# Patient Record
Sex: Male | Born: 1945 | Race: White | Hispanic: No | Marital: Married | Smoking: Former smoker
Health system: Southern US, Community
[De-identification: ages and names within clinical notes are randomized; demographics above are authoritative.]

## PROBLEM LIST (undated history)

## (undated) DIAGNOSIS — N200 Calculus of kidney: Secondary | ICD-10-CM

## (undated) DIAGNOSIS — G5603 Carpal tunnel syndrome, bilateral upper limbs: Secondary | ICD-10-CM

## (undated) DIAGNOSIS — I219 Acute myocardial infarction, unspecified: Secondary | ICD-10-CM

## (undated) DIAGNOSIS — I2510 Atherosclerotic heart disease of native coronary artery without angina pectoris: Secondary | ICD-10-CM

## (undated) DIAGNOSIS — K760 Fatty (change of) liver, not elsewhere classified: Secondary | ICD-10-CM

## (undated) DIAGNOSIS — E538 Deficiency of other specified B group vitamins: Secondary | ICD-10-CM

## (undated) DIAGNOSIS — K219 Gastro-esophageal reflux disease without esophagitis: Secondary | ICD-10-CM

## (undated) DIAGNOSIS — K7689 Other specified diseases of liver: Secondary | ICD-10-CM

## (undated) DIAGNOSIS — I10 Essential (primary) hypertension: Secondary | ICD-10-CM

## (undated) DIAGNOSIS — E785 Hyperlipidemia, unspecified: Secondary | ICD-10-CM

## (undated) DIAGNOSIS — C679 Malignant neoplasm of bladder, unspecified: Secondary | ICD-10-CM

## (undated) DIAGNOSIS — K5790 Diverticulosis of intestine, part unspecified, without perforation or abscess without bleeding: Secondary | ICD-10-CM

## (undated) HISTORY — DX: Malignant neoplasm of bladder, unspecified: C67.9

## (undated) HISTORY — DX: Calculus of kidney: N20.0

## (undated) HISTORY — DX: Acute myocardial infarction, unspecified: I21.9

## (undated) HISTORY — DX: Atherosclerotic heart disease of native coronary artery without angina pectoris: I25.10

## (undated) HISTORY — DX: Gastro-esophageal reflux disease without esophagitis: K21.9

## (undated) HISTORY — PX: LUMBAR LAMINECTOMY: SHX95

## (undated) HISTORY — DX: Deficiency of other specified B group vitamins: E53.8

## (undated) HISTORY — DX: Essential (primary) hypertension: I10

## (undated) HISTORY — DX: Other specified diseases of liver: K76.89

## (undated) HISTORY — DX: Carpal tunnel syndrome, bilateral upper limbs: G56.03

## (undated) HISTORY — DX: Hyperlipidemia, unspecified: E78.5

## (undated) HISTORY — DX: Fatty (change of) liver, not elsewhere classified: K76.0

## (undated) HISTORY — DX: Diverticulosis of intestine, part unspecified, without perforation or abscess without bleeding: K57.90

---

## 1999-11-05 ENCOUNTER — Other Ambulatory Visit: Admission: RE | Admit: 1999-11-05 | Discharge: 1999-11-05 | Payer: Self-pay | Admitting: Urology

## 2000-03-06 ENCOUNTER — Other Ambulatory Visit: Admission: RE | Admit: 2000-03-06 | Discharge: 2000-03-06 | Payer: Self-pay | Admitting: Urology

## 2000-03-31 ENCOUNTER — Encounter: Payer: Self-pay | Admitting: Urology

## 2000-03-31 ENCOUNTER — Ambulatory Visit (HOSPITAL_BASED_OUTPATIENT_CLINIC_OR_DEPARTMENT_OTHER): Admission: RE | Admit: 2000-03-31 | Discharge: 2000-03-31 | Payer: Self-pay | Admitting: Urology

## 2000-03-31 ENCOUNTER — Encounter: Admission: RE | Admit: 2000-03-31 | Discharge: 2000-03-31 | Payer: Self-pay | Admitting: Urology

## 2000-03-31 ENCOUNTER — Encounter (INDEPENDENT_AMBULATORY_CARE_PROVIDER_SITE_OTHER): Payer: Self-pay | Admitting: Specialist

## 2000-04-13 ENCOUNTER — Encounter: Payer: Self-pay | Admitting: Neurosurgery

## 2000-04-13 ENCOUNTER — Ambulatory Visit (HOSPITAL_COMMUNITY): Admission: RE | Admit: 2000-04-13 | Discharge: 2000-04-13 | Payer: Self-pay | Admitting: Neurosurgery

## 2000-04-24 ENCOUNTER — Encounter: Payer: Self-pay | Admitting: Neurosurgery

## 2000-04-24 ENCOUNTER — Encounter: Admission: RE | Admit: 2000-04-24 | Discharge: 2000-04-24 | Payer: Self-pay | Admitting: Neurosurgery

## 2001-08-14 ENCOUNTER — Other Ambulatory Visit: Admission: RE | Admit: 2001-08-14 | Discharge: 2001-08-14 | Payer: Self-pay | Admitting: Urology

## 2003-05-31 DIAGNOSIS — C679 Malignant neoplasm of bladder, unspecified: Secondary | ICD-10-CM

## 2003-05-31 HISTORY — PX: BLADDER SURGERY: SHX569

## 2003-05-31 HISTORY — DX: Malignant neoplasm of bladder, unspecified: C67.9

## 2004-01-10 ENCOUNTER — Emergency Department (HOSPITAL_COMMUNITY): Admission: EM | Admit: 2004-01-10 | Discharge: 2004-01-10 | Payer: Self-pay | Admitting: *Deleted

## 2004-01-11 ENCOUNTER — Inpatient Hospital Stay (HOSPITAL_COMMUNITY): Admission: EM | Admit: 2004-01-11 | Discharge: 2004-01-12 | Payer: Self-pay | Admitting: Emergency Medicine

## 2005-05-30 DIAGNOSIS — I219 Acute myocardial infarction, unspecified: Secondary | ICD-10-CM

## 2005-05-30 HISTORY — PX: OTHER SURGICAL HISTORY: SHX169

## 2005-05-30 HISTORY — DX: Acute myocardial infarction, unspecified: I21.9

## 2006-02-13 ENCOUNTER — Inpatient Hospital Stay (HOSPITAL_BASED_OUTPATIENT_CLINIC_OR_DEPARTMENT_OTHER): Admission: RE | Admit: 2006-02-13 | Discharge: 2006-02-13 | Payer: Self-pay | Admitting: Cardiology

## 2006-02-16 ENCOUNTER — Ambulatory Visit (HOSPITAL_COMMUNITY): Admission: RE | Admit: 2006-02-16 | Discharge: 2006-02-17 | Payer: Self-pay | Admitting: Cardiology

## 2006-03-20 ENCOUNTER — Inpatient Hospital Stay (HOSPITAL_COMMUNITY): Admission: RE | Admit: 2006-03-20 | Discharge: 2006-03-24 | Payer: Self-pay | Admitting: Cardiothoracic Surgery

## 2006-04-13 ENCOUNTER — Encounter (HOSPITAL_COMMUNITY): Admission: RE | Admit: 2006-04-13 | Discharge: 2006-06-29 | Payer: Self-pay | Admitting: Cardiology

## 2006-07-19 ENCOUNTER — Ambulatory Visit (HOSPITAL_BASED_OUTPATIENT_CLINIC_OR_DEPARTMENT_OTHER): Admission: RE | Admit: 2006-07-19 | Discharge: 2006-07-19 | Payer: Self-pay | Admitting: Urology

## 2006-07-19 ENCOUNTER — Encounter (INDEPENDENT_AMBULATORY_CARE_PROVIDER_SITE_OTHER): Payer: Self-pay | Admitting: Specialist

## 2007-01-10 ENCOUNTER — Encounter (INDEPENDENT_AMBULATORY_CARE_PROVIDER_SITE_OTHER): Payer: Self-pay | Admitting: Urology

## 2007-01-10 ENCOUNTER — Ambulatory Visit (HOSPITAL_BASED_OUTPATIENT_CLINIC_OR_DEPARTMENT_OTHER): Admission: RE | Admit: 2007-01-10 | Discharge: 2007-01-10 | Payer: Self-pay | Admitting: Urology

## 2007-12-03 ENCOUNTER — Emergency Department (HOSPITAL_COMMUNITY): Admission: EM | Admit: 2007-12-03 | Discharge: 2007-12-03 | Payer: Self-pay | Admitting: Emergency Medicine

## 2010-10-12 NOTE — Op Note (Signed)
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   OPERATION:  Transurethral resection of bladder tumor anterior midline  (1.5 cm).   SURGEON:  Courtney Paris, M.D.   ANESTHESIA:  General.   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  He had his first pathology diagnosed 1998, had this  treated with BCG elsewhere and the last treatment September 2006.  He  had a November 2007 which showed CIS.  He started back on BCG, he  finished this Oct 25, 2006.  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  He comes in for biopsy and removal of these  areas.  He has allergies to CODEINE AND MORPHINE.   The patient was placed on the operative table in the dorsal lithotomy  position after satisfactory induction of general anesthesia.  Prepped  and draped with Betadine in the usual sterile fashion.  Given IV Cipro.  The panendoscope was inserted.  The anterior urethra was not strictured.  The posterior urethra showed mild bilobar enlargement of prostate.  The  bladder was entered.  The areas of the anterior midline base were then  photographed and then with cold cup biopsy forceps, the largest area was  removed and smaller areas were just fulgurated.  Total of about 1.5 cm  of tissue was removed from the largest area.  Hemostasis was  carefully  achieved with a Bugbee electrode and photographs of the treated areas  were then made and the bladder was drained, scope removed.  The patient  taken recovery room in good condition to be later discharged as an  outpatient.  If this is recurrent CIS, then we will need to add Intron  to his BCG therapy.  If not, we will continue with BCG.      Courtney Paris, M.D.  Electronically Signed     HMK/MEDQ  D:  01/10/2007  T:  01/11/2007  Job:  [id number]

## 2010-10-15 NOTE — Op Note (Signed)
[place]  Patient:    Kyle Baldwin, Kyle Baldwin                      MRN: [id number] Proc. Date: 03/31/00 Adm. Date:  [id number] Attending:  Katherine Roan                           Operative Report  PREOPERATIVE DIAGNOSIS:  Inflammatory lesion of left lateral wall, hematuria, atypical cytology, previous carcinoma in situ.  POSTOPERATIVE DIAGNOSIS:  Inflammatory lesion of left lateral wall, hematuria, atypical cytology, previous carcinoma in situ.  OPERATION:  TUR of bladder, left lateral wall (2.5 cm).  ANESTHESIA:  General.  SURGEON:  Rozanna Boer., M.D.  BRIEF HISTORY:  This is a 65 year old patient who is admitted with atypical cells on cytology last month with an inflammatory area in the left lateral wall seen on biopsy.  He has had no irritative symptoms, but has had microscopic hematuria.  He had previous CIS, but then finished bCG treatment, April 1997, and has had none since.  Also, has history of left renal calculus in 1996, but has never passed a stone, and x-rays have been negative since then.  DESCRIPTION OF PROCEDURE:  The patient was placed on the operating table in the dorsolithotomy position.  After satisfactory induction of general anesthesia, was prepped and draped with Betadine in the usual sterile fashion. A #21 panendoscope was inserted and no anterior strictures seen.  Posterior leaflet was not obstructing.  The bladder was entered.  It was viewed with a right angle and a 4 oblique lens.  The only abnormal area was just lateral to the left orifice.  Pictures were made and it was mildly inflammatory with a papillary component to this.  The maximum extent of this was about 2.5 cm. Using a cold cup biopsy forceps, this area was completely resected and the base fulgurated with the Bugbee electrode.  This effected good hemostasis. The bladder was drained and filled with 20 cc of 0.25% Marcaine  for postoperative pain relief.  The patient was then taken to the recovery room in good condition and will be later discharged as an outpatient with detailed written instructions. DD:  03/31/00 TD:  03/31/00 Job: [id number]

## 2010-10-15 NOTE — Consult Note (Signed)
Kyle Baldwin, Kyle Baldwin               ACCOUNT NO.:  [id number]   MEDICAL RECORD NO.:  [id number]          PATIENT TYPE:  INP   LOCATION:  NA                           FACILITY:  [facility]   PHYSICIAN:  Sheliah Plane, MD    DATE OF BIRTH:  Jan 20, 1946   DATE OF CONSULTATION:  03/16/2006  DATE OF DISCHARGE:                                   CONSULTATION   REASON FOR CONSULTATION:  Coronary occlusive disease.   HISTORY OF PRESENT ILLNESS:  The patient is a 65 year old male who began  having left back pain when he awoke in the morning; this progressed into the  left shoulder with anterior chest discomfort, more common in the morning,  sometimes awakening him.  He denies the pain increasing during the day with  increasing activity.  Because of these symptoms, cardiology evaluation was  recommended.  The patient was seen by Dr. Patty Sermons and cardiac  catheterization was performed by Dr. Swaziland.  The patient had disease in his  LAD and distal right coronary artery.  Attempts at angioplasty of the right  resulted in a difficult angioplasty and occlusion of the posterior  descending.  Ultimately the angiographic result was adequate but the patient  has continued to have substernal chest pain and likely re-stenosed.  He has  a history of hypertension.  Denies diabetes and denies smoking.  Has history  of hyperlipidemia.   FAMILY HISTORY:  His father's history is unknown.  The patient's mother has  atrial fibrillation and a pacemaker.  He has a half-brother and sister who  are healthy.  Maternal uncles have valve disease and myocardial infarction.   PAST MEDICAL HISTORY:  Denies renal insufficiency.  Denies previous stroke.  Denies claudication.  Has a history of bladder carcinoma in situ, treated by  Dr. Aldean Ast.   PAST SURGICAL HISTORY:  Includes bladder biopsy and back surgery.   SOCIAL HISTORY:  The patient is married.  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  Denies  alcohol use.   CURRENT MEDICATIONS:  Include Toprol XL 50 1/2 tablet daily, aspirin 325  daily, nitroglycerin p.r.n., and Lipitor 20 mg daily.   DRUG ALLERGIES:  CODEINE CAUSES NAUSEA.  IMDUR CAUSES HEADACHE.   REVIEW OF SYSTEMS:  Positive for chest pain.  Denies resting shortness of  breath.  Denies exertional shortness of breath.  Denies orthopnea, pre-  syncope, syncope, palpitations or lower extremity edema.  General review of  systems - he does wear glasses, notes above chest discomfort and chest  pressure; denies shortness of breath.  Has urinary frequency and nocturia,  up to 4-5 times a night.  Does have joint pain in the shoulders and knees.  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   PHYSICAL EXAMINATION:  VITAL SIGNS:  He is 6 feet tall and weighs 225  pounds.  Blood pressure is 144/82, pulse is 58 and regular and respiratory  rate is 18.  GENERAL:  He is awake and alert, neurologically intact, and  able to relate  his history without difficulty.  LUNGS:  Clear bilaterally.  He has no carotid bruits.  CARDIAC:  Exam reveals regular rate and rhythm without murmur or gallop.  ABDOMEN:  Exam is benign without palpable masses or tenderness.  EXTREMITIES:  Examination of the lower extremities reveals 2+ DP and PT  pulses bilaterally.   DATA REVIEWED:  Cardiac catheterization films and angioplasty films are  reviewed.  The patient has 20% left main, LAD has proximal 80-90% stenosis,  the first and second diagonal are very small and subtotally occluded,  circumflex has 3 marginal branches with 30% narrowing proximally, the right  coronary artery has high-grade distal stenotic lesion at the take-off of the  PD and PL in the 90-95% range.  This is the area that was angioplastied.   ASSESSMENT AND PLAN:  After reviewing the patient's history and films, I  agree with the recommendation to proceed  with coronary artery bypass  grafting.  The risks and options of surgery, including death, infection,  stroke, myocardial infarction, or needing blood transfusion were all  discussed with the patient in detail.  He is willing to proceed.  Will make  arrangements for surgery on Monday, March 20, 2006.      Sheliah Plane, MD  Electronically Signed     EG/MEDQ  D:  03/16/2006  T:  03/16/2006  Job:  [id number]   cc:   Peter M. Swaziland, M.D.  Cassell Clement, M.D.  Barry Dienes Eloise Harman, M.D.

## 2010-10-15 NOTE — Op Note (Signed)
NAMEBENJERMAN, Kyle Baldwin               ACCOUNT NO.:  [id number]   MEDICAL RECORD NO.:  [id number]          PATIENT TYPE:  HAMB   LOCATION:                               FACILITY:  [facility]   PHYSICIAN:  Courtney Paris, M.D.DATE OF BIRTH:  [birth year]   DATE OF PROCEDURE:  07/19/2006  DATE OF DISCHARGE:                               OPERATIVE REPORT   PREOPERATIVE DIAGNOSIS:  Possible carcinoma in situ, hematuria with  abnormal cytology.   POSTOP DIAGNOSIS:  Possible carcinoma in situ, hematuria with abnormal  cytology.   OPERATION:  Transurethral resection TUR bladder lesion left posterior  wall, posterior midline, and dome of the bladder.   ANESTHESIA:  General.   SURGEON:  Courtney Paris, M.D.   BRIEF HISTORY:  This 65 year old patient was admitted with some blood in  his urine; and on cystoscopy has three inflammatory areas most of them  on the left lateral wall, but one at the dome as well.  He had carcinoma  in situ last treated 1998 with 6 BCG treatments; but then he was lost to  followup.  His last cystoscopy was done at the [place] in September 2006; he  was hospitalized with pyelonephritis at that time.  I did a cystoscopy  last week, and he had 3 areas of possible CIS on the left lateral wall;  and had an atypical cytology.  He enters to have these resected at this  time, then if it is CIS get back on the BCG therapy.   The patient was placed on the operating table in the dorsal lithotomy  position.  After satisfactory induction of general anesthesia, he was  prepped and draped with Betadine in the usual sterile fashion.  He was  given IV antibiotics.  The bladder was carefully inspected using the  right angle and the 4-oblique lens.  The lesions on the left lateral  wall were photographed.  There were fairly close.  There were 3 lesions  fairly close on the left bladder wall, one in the posterior midline, and  one at the dome.  After photographing, these and these  were biopsied and  actually removed with the cold cup biopsy forceps.  The area in the left  posterior wall, the maximum diameter, was about 3 cm.  The other ones in  the posterior midline, in the dome, were smaller.  These were then  fulgurated with the Bugbee electrode to effect good hemostasis; and I  did not see the need to leave the catheter.  The rest of the bladder  looked normal bladder.  The bladder was drained, scope removed; was  given some Toradol; and was taken recovery room in good condition.  He  will be later discharged as an outpatient pending the path report.      Courtney Paris, M.D.  Electronically Signed     HMK/MEDQ  D:  07/19/2006  T:  07/19/2006  Job:  [id number]

## 2010-10-15 NOTE — Discharge Summary (Signed)
NAME:  Kyle Baldwin                         ACCOUNT NO.:  [id number]   MEDICAL RECORD NO.:  [id number]                   PATIENT TYPE:  INP   LOCATION:  5502                                 FACILITY:  [facility]   PHYSICIAN:  Sherin Quarry, MD                   DATE OF BIRTH:  [birth year]   DATE OF ADMISSION:  01/11/2004  DATE OF DISCHARGE:                                 DISCHARGE SUMMARY   Kyle Baldwin is a 65 year old man who has yearly cystoscopies to follow up  from bladder cancer that was diagnosed eight years ago. He has been followed  by a urologist with a [facility] in [place]. A cystoscopy was done August  10th which apparently routine and showed no signs of problems in the  bladder. On January 08, 2004, the patient began to experience nausea and low-  grade fever. On Friday, i.e., January 09, 2004, the patient developed high  fever and presented to the emergency room. His urine was found to reveal  evidence of pyuria and he was given one dose of intravenous Cipro and was  sent home on oral Cipro. He came back to the emergency room on August 14th  and continued to experience high fevers, chills, and nausea. Therefore,  arrangements were made for his admission.   Physical exam at the time of admission revealed temperature of 104.2, blood  pressure 110/62, heart rate 130. HEENT exam was within normal limits. The  chest was clear to auscultation and percussion. Cardiovascular exam revealed  normal S1 and S2 without murmurs, rubs, or gallops. The abdomen was benign  with normal bowel sounds, without masses or tenderness. There was no  guarding or rebound. Neurologic testing and examination of the extremities  were  normal.   Laboratory studies obtained included CBC which revealed white count of  12,300, hemoglobin 15.2, creatinine 1.3, BUN 17, potassium 4.2. Urinalysis  was positive for nitrites. There were 10 cells per high power field. There  were many bacteria seen. Liver  profile was normal. Blood cultures are  negative to date. Subsequently the urine culture grew greater than 100,000  colonies E. coli which were sensitive to all antibiotics tested. On  admission the patient was placed on IV fluids in the form of normal saline  at 150 cc per hour. Cipro was given to him at 400 mg IV q.12h. A CT scan of  the abdomen and pelvis was performed and this showed no renal calculi, no  obstruction, no ureteral calculi, and no evidence of abscess. A chest x-ray  was notable for bibasilar atelectasis. Cipro 400 mg IV q.12h. was continued.  Subsequently during his hospitalization the patient remained afebrile. His  vital signs were stable and heart rate came down. He indicated that he had  some urinary hesitancy, but otherwise, no dysuria or back pain. By January 12, 2004, the patient seemed to be doing well  and was felt reasonable to  discharge him at that time.   DISCHARGE DIAGNOSES:  1. Urinary tract infection probably secondary to prostatitis with history of     recent cystoscopy.  2. History of bladder cancer, status post surveillance cystoscopy.  3. Hypertension.   DISCHARGE MEDICATIONS:  The patient will continue his metoprolol 25 mg  daily. He was advised to continue his Cipro for ten additional days at a  dose of 500 mg b.i.d. to hopefully eradicate prostate source of infection.  He  was advised to follow up with Dr. Lyn Hollingshead in approximately ten days. It  was emphasized that he should make sure that when he is working he maintains  an adequate level of hydration to keep up a good urine output.   CONDITION AT THE TIME OF DISCHARGE:  Good.                                                Sherin Quarry, MD    SY/MEDQ  D:  01/12/2004  T:  01/12/2004  Job:  [id number]   cc:   Attn: Dr. Lyn Hollingshead [facility]  [address]  [place], [place] [address]

## 2010-10-15 NOTE — Discharge Summary (Signed)
NAMEJERIEL, Kyle Baldwin NO.:  [id number]   MEDICAL RECORD NO.:  [id number]          PATIENT TYPE:  OIB   LOCATION:  6531                         FACILITY:  [facility]   PHYSICIAN:  Peter M. Swaziland, M.D.  DATE OF BIRTH:  1945-09-06   DATE OF ADMISSION:  02/16/2006  DATE OF DISCHARGE:  02/17/2006                                 DISCHARGE SUMMARY   HISTORY OF PRESENT ILLNESS:  Mr. Kyle Baldwin is a 65 year old white male who  presented with angina pectoris.  Cardiolite study was abnormal which  subsequently led to cardiac catheterization.  This demonstrated a culprit  lesion in the distal right coronary at the posterolateral and PDA  bifurcation.  He also had an 80% stenosis in the proximal LAD and diffuse 50-  60% disease in the mid LAD.  He was brought back for intervention of the  distal right coronary.  The patient had been on aspirin and Plavix.  For  details of past medical history, social history, and family history, please  see the increase in admission history and physical.   HOSPITAL COURSE:  The patient was brought to the cardiac catheterization  laboratory.  He underwent intervention of the distal right coronary.  This  proved to be a very difficult intervention.  The takeoff of the right  coronary artery was somewhat anterior making it difficult to achieve good  guide support.  We were able to the best guide support with a left Amplatz 1  guide.  This still resulted in poor visualization.  We were able to  intervene on the distal right coronary for a balloon angioplasty across the  distal right coronary posterolateral branch and PDA.  This was done with a  double wire technique and alternatively balloon dilating each branch.  Again, it was technically very difficult due to the acute angulation of the  posterolateral and PDA branches from the distal right coronary.  However, we  did achieve acceptable angiographic result with less than 30% residual  stenosis.  The  patient did have a lot of left scapular and shoulder pain  during the procedure as well as pain in his throat.  This resolved at the  end of the procedure and he had no subsequent pain.  His ECG post procedure  was normal.  His BMET and CBC remained stable.  His CPK was 50 with 2.2 MB  and he did have a slight troponin leak of 0.11. He did well and was  progressively ambulated without further difficulty.  He had no significant  groin hematoma and was discharged home in stable condition on February 17, 2006.   DISCHARGE DIAGNOSIS:  1. 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  2. Hypertension.  3. Hypercholesterolemia.   DISCHARGE MEDICATIONS:  Enteric coated aspirin 325 mg per day, Plavix 75 mg  per day, Toprol XL 50 mg daily, and Lipitor 20 mg per day.   DISCHARGE INSTRUCTIONS:  The patient is to slowly increase his activity,  avoid heavy lifting or straining for one week.  He will follow up with Dr.  Swaziland in two  weeks.  I think at this point I would like to see how much his  anginal symptoms improve and whether this is sustained over the next several  weeks.  I think the distal right coronary lesion does have at least a  moderate risk of restenosis and if this were to occur early, we would  probably  consider the patient for bypass surgery.  However, if the right coronary  remains patent and the patient has significant improvement in symptoms,  would consider bringing him back at a later date for stenting of the  proximal LAD.  Discharge status is improved.           ______________________________  Peter M. Swaziland, M.D.     PMJ/MEDQ  D:  02/17/2006  T:  02/19/2006  Job:  [id number]   cc:   Cassell Clement, M.D.  Barry Dienes Eloise Harman, M.D.

## 2010-10-15 NOTE — Cardiovascular Report (Signed)
NAMEJARREAU, CALLANAN NO.:  [id number]   MEDICAL RECORD NO.:  [id number]          PATIENT TYPE:  OIB   LOCATION:  6531                         FACILITY:  [facility]   PHYSICIAN:  Peter M. Swaziland, M.D.  DATE OF BIRTH:  February 18, 1946   DATE OF PROCEDURE:  02/16/2006  DATE OF DISCHARGE:  02/17/2006                              CARDIAC CATHETERIZATION   INDICATIONS FOR PROCEDURE:  A 65 year old white male who presents with  recent onset of angina.  He had abnormal Cardiolite study.  Subsequent  cardiac catheterization demonstrated 2-vessel obstructive coronary disease.  He had a focal 80% lesion in the proximal LAD.  He had diffuse 50-60%  disease in the mid-LAD.  The culprit lesion appeared to be a high-grade 95%  stenosis at the bifurcation of the distal right coronary artery into the  posterolateral and PDA branches.  The patient was brought back today for  catheter-based intervention.   PROCEDURE:  Percutaneous transluminal coronary angioplasty of the distal  right coronary artery/PDA/posterolateral branch bifurcation lesion.  Access  via the right femoral artery using the standard Seldinger technique.   EQUIPMENT USED:  A 6-French and left Amplatz one guide, a 0.014 high-torque  floppy wire, a 0.0114 Prowater wire, a 2.0 x 15 mm Maverick balloon and a  2.5 x 58 mm Quantum Maverick balloon.   CONTRAST:  310 mL of Omnipaque   MEDICATIONS:  Angiomax bolus at 0.75 mg/kg followed by infusion of 1.75  mg/kg per hour.  Subsequent ACT was 407, Versed a total of 3 mg IV, fentanyl  a total of 25 mcg IV nitroglycerin 200 mcg intracoronary x2.  The patient  remained hemodynamically stable throughout procedure.   PROCEDURE NOTE:  After initial guide shots were obtained, we proceeded with  intervention of the distal right coronary artery lesion.  This proved to be  a very difficult lesion to dilate.  The right coronary arose somewhat  anterior making good guide support  difficult.  An FR-4 guide and a hockey  stick guide gave inadequate support.  Left Amplatz guide did give adequate  support but yielded relatively poor visualization.  We were able, initially,  to cross the distal lesion with a high-torque floppy wire positioning the  tip of the wire in the posterolateral branch.  Despite multiple attempts; we  were unable to pass a wire down the posterior descending artery.  In fact  with crossing the lesion and placing the wire down the posterolateral  branch.  The PDA was occluded proximally.  He did appear to have some  collateral support from a right ventricular marginal branch.   Given our inability to cross the PDA, we then performed a dilatation of the  distal right coronary artery and posterolateral branch using the 2.0 mm  Maverick dilating at 6 and 9 atmospheres.  At this point, we were able to  pass a Prowater wire down the PDA and use the same 2.0 mm Maverick balloon  to dilate the distal right coronary/PDA to 6 and 9 atmospheres.  This did  restore flow down the PDA.  It is noteworthy  that with each balloon  inflation, the patient had significant increase in chest pain, shoulder  pain, and ST elevation that resolved with balloon deflation.  We then  upgraded to a 2.5 x 15 mm Quantum Maverick balloon.  We initially used this  to dilate the distal right coronary/PDA at 8 and then 12 atmospheres.  We  then withdrew the balloon and placed it on the other wire; and dilated the  distal right coronary/posterolateral branch to 12 atmospheres x2.  This  yielded an acceptable angiographic result at this point.  The patient had  less than 20-30% residual stenosis most notable at the distal right coronary  artery and the proximal PDA.  There was TIMI grade 3 flow.  He had 1/10  chest discomfort, at this point; and was hemodynamically stable.  The  patient was maintained on IV nitroglycerin at the end of the procedure, and  transferred to telemetry for  further monitoring   FINAL IMPRESSION:  Successful percutaneous coronary angioplasty of the  distal right coronary at the posterolateral and posterior descending  bifurcation.           ______________________________  Peter M. Swaziland, M.D.     PMJ/MEDQ  D:  02/16/2006  T:  02/18/2006  Job:  [id number]   cc:   Barry Dienes. Eloise Harman, M.D.  Cassell Clement, M.D.

## 2010-10-15 NOTE — H&P (Signed)
NAMEMarland Baldwin  DEKLYN, GIBBON NO.:  [id number]   MEDICAL RECORD NO.:  [id number]            PATIENT TYPE:   LOCATION:                                 FACILITY:   PHYSICIAN:  Peter M. Swaziland, M.D.  DATE OF BIRTH:  23-Mar-1946   DATE OF ADMISSION:  02/13/2006  DATE OF DISCHARGE:                                HISTORY & PHYSICAL   HISTORY OF PRESENT ILLNESS:  Mr. Kyle Baldwin is a very pleasant 65 year old white  male who is seen for evaluation of exertional chest pain.  Over the past 3  weeks the patient reports symptoms of left precordial chest pain.  Initially  it started out in his left scapula to his left axilla and would last for one  hour at a time.  It typically occurred early in the morning and would then  abate; however, the past week he has had more complaints of throat pain and  pressure with exertion that has been progressive.  He was seen by Dr. Cassell Clement and subsequently underwent a stress Cardiolite study.  The patient  had typical anginal symptoms.  An electrocardiogram showed no significant ST  changes, but his Cardiolite images demonstrated significant anterior lateral  wall ischemia as well as a partially-reversible inferior wall defect.  His  ejection fraction was 51%.  Given his high-risk scan, it is recommended that  he undergo a cardiac catheterization.  The patient has had no prior history  of cardiac disease.  He does have a history of hypertension and  hypercholesterolemia.   PAST MEDICAL HISTORY:  1. History of bladder cancer and was treated by Dr. Dennison Nancy. Kimbrough      in 1997, without recurrence.  2. History of hypertension.  3. History of hypercholesterolemia.   PAST SURGICAL HISTORY:  He has had previous lumbar disk surgery.   ALLERGIES:  CODEINE.   CURRENT MEDICATIONS:  1. Toprol XL 50 mg daily.  2. Aspirin daily.   SOCIAL HISTORY:  The patient works two jobs.  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  He denies tobacco or  alcohol use.  He does not exercise regularly but states he just works hard.   FAMILY HISTORY:  His father's history is unknown.  Mother is alive at age 23  and has a pacemaker.  He has two half- siblings in good health.   REVIEW OF SYSTEMS:  He has no claudication.  He denies any history of TIA or  stroke.  He has had no palpitations.  His bowel and bladder habits have been  normal.  Other review of systems are negative.   PHYSICAL EXAMINATION:  The patient is a pleasant white male in no distress.  VITAL SIGNS:  Weight is 232 pounds, blood pressure 160/100, pulse is 60 and  regular, respirations were normal.  HEENT: Normocephalic, atraumatic.  Pupils equal, round, reactive to light  and accommodation.  Extraocular movements were full.  Oropharynx is clear.  NECK:  Supple without JVD, adenopathy, thyromegaly or bruits.  LUNGS:  Clear to  auscultation and percussion.  HEART:  Reveals regular rate and rhythm without gallop, murmur, rub or  click.  He has no chest wall tenderness to palpation  ABDOMEN:  Soft, nontender without splenomegaly masses or bruits.  PULSES:  Femoral and pedal pulses were 2+ and symmetric.  He has no edema or  phlebitis.  NEUROLOGIC:  Neurologic exam is nonfocal.   LABORATORY DATA:  ECG shows normal sinus rhythm with early transition in the  precordial leads, otherwise no acute change.   CHEST X-RAY:  Shows no active disease.   Coags were normal.  BUN is 24, creatinine 1.3.  Electrolytes were normal.  CBC is normal.  Recent lipid panel showed a cholesterol 207, triglycerides  138, HDL 38, LDL of 141.   IMPRESSION:  1. Recent onset of exertional angina and nonexertional angina with a      markedly abnormal stress Cardiolite study.  2. Hypertension.  3. Hypercholesterolemia.   PLAN:  Will proceed with diagnostic cardiac catheterization with further  therapy pending these results.            ______________________________  Peter M. Swaziland, M.D.     PMJ/MEDQ  D:  02/10/2006  T:  02/10/2006  Job:  [id number]   cc:   Cassell Clement, M.D.  Barry Dienes Eloise Harman, M.D.

## 2010-10-15 NOTE — H&P (Signed)
NAME:  Kyle Baldwin, Kyle Baldwin                         ACCOUNT NO.:  [id number]   MEDICAL RECORD NO.:  [id number]                   PATIENT TYPE:  INP   LOCATION:  5502                                 FACILITY:  [facility]   PHYSICIAN:  Georgann Housekeeper, M.D.                 DATE OF BIRTH:  1945-07-24   DATE OF ADMISSION:  01/11/2004  DATE OF DISCHARGE:                                HISTORY & PHYSICAL   This is an unassigned patient who is seen at the [facility].   CHIEF COMPLAINT:  Fevers and chills.   HISTORY OF PRESENT ILLNESS:  He is a 65 year old male who has history of  bladder cancer 8 years ago, has been getting yearly cystoscopies.  Has been  under control by the [facility], Dr. Deeann Dowse and in [place] had a  cystoscopy done on January 07, 2004 which was routine.  According to the wife  the patient's cystoscopy was negative and he started getting sick on the day  after with a little bit of nausea and a little bit of fever.  He unable to  urinate symptoms.  Friday got a really high fever and came to the emergency  room on January 10, 2004 where his urine was found to be positive for urinary  tract infection and had a low-grade temperature of 100.  He was given and  intravenous dose and sent home on p.o. Cipro.  He comes back to the  emergency room with high fevers and chills with nausea.  In the emergency  room he was found to have a fever of 104, chills, and tachycardic, feeling  weak.   REVIEW OF SYSTEMS:  Positive for fevers, nausea an some mild obstructive  symptoms although he said he was able to urinate today, no blood in the  urine.  No back pain, no abdominal pain, no chest pain or headaches.   PAST MEDICAL HISTORY:  1. Bladder cancer 8 years ago.  He initially saw Dr. Aldean Ast but currently     had been followed by a doctor at [facility].  He gets his yearly     cystoscopy and has had no recurrence.  2. Hypertension.   ALLERGIES:  SULFA AND CODEINE.   MEDICATIONS:  1. Metoprolol 25 mg.  2. Phenergan p.r.n.   PAST SURGICAL HISTORY:  Cystoscopy for bladder carcinoma; status post back  surgery for a rupture disk in 1998.   SOCIAL HISTORY:  No tobacco or alcohol.  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   FAMILY HISTORY:  Noncontributory.   PHYSICAL EXAMINATION:  VITAL SIGNS:  Temperature 104.2 on arrival, blood  pressure 110/62, heart rate of 136, now on exam 110.  GENERAL:  Alert and oriented, in no acute distress.  HEENT:  Pupils reactive.  Extraocular movements are intact.  LUNGS:  Clear.  CARDIAC:  S1, S2 are normal.  ABDOMEN:  Soft without any tenderness, no organomegaly.  NEURO EXAM:  Nonfocal.   LABORATORY DATA:  From January 10, 2004:  His white count was 12.3 thousand.  Creatinine 1.3, BUN of 17,  potassium was 4.2.  Chest x-ray obtained today is pending.  Blood culture  and urine culture will be sent.  Blood work from this evening is pending.   IMPRESSION:  55. A 65 year old male with a history of bladder cancer and recent cystoscopy     at the [place] in [place], now with fevers, chills, elevated white count,     urinary tract infection, rule out sepsis.  2. Dehydration.   PLAN:  Admit to telemetry, intravenous fluids, IV antibiotics with Cipro.  Follow blood and urine cultures.  Will get abdominal CAT scan to rule out  hydronephrosis and obstruction.   1. History of hypertension - hold pressors for now.                                                Georgann Housekeeper, M.D.    KH/MEDQ  D:  01/11/2004  T:  01/11/2004  Job:  [id number]

## 2010-10-15 NOTE — Cardiovascular Report (Signed)
NAMEJERMAYNE, Kyle Baldwin NO.:  [id number]   MEDICAL RECORD NO.:  [id number]          PATIENT TYPE:  OIB   LOCATION:  1967                         FACILITY:  [facility]   PHYSICIAN:  Peter M. Swaziland, M.D.  DATE OF BIRTH:  03/02/1946   DATE OF PROCEDURE:  02/13/2006  DATE OF DISCHARGE:                              CARDIAC CATHETERIZATION   INDICATIONS FOR PROCEDURE:  [age]-year-old white male who presents  with recent onset of typical exertional angina.  Stress Cardiolite study was  abnormal, showing evidence of anterolateral and inferior wall ischemia  and/or infarction.  The patient does have a history of hypertension and  hypercholesterolemia.   PROCEDURES:  1. Left heart catheterization.  2. Coronary left ventricular angiography.   ACCESS:  Via the right femoral artery using standard Seldinger technique.   EQUIPMENT:  A 4-French 4-cm left Judkins catheter, a 4-French right Noto  catheter, 4-French pigtail catheter, 4-French arterial sheath.   MEDICATIONS:  Local anesthesia with 1% Xylocaine.   CONTRAST:  90 mL of Omnipaque.   HEMODYNAMIC DATA:  Aortic pressures is 139/77 with mean of 105 mmHg.  Left  ventricular pressure is 138 with EDP of 8 mmHg.   ANGIOGRAPHIC DATA:  The left coronary artery arises and distributes  normally.  The left main coronary has mild irregularities of less 20%.   The left anterior descending artery is a large vessel which extends out  around the apex.  The proximal vessel has a focal 80% to 90% stenosis.  The  mid-vessel has a long segmented disease up to 50% to 70% that is irregular.  The first and second diagonal branches are very small in caliber and are  both subtotally occluded at their origin at 99%.  The vessels are small and  appear to be only a millimeter in diameter.   The left circumflex coronary artery is very tortuous in the proximal vessel.  It gives rise to 3 marginal vessels.  There is 30% narrowing in the  circumflex proximally, otherwise no significant disease.   The right coronary artery rises and distributes normally.  It is the  dominant vessel.  The proximal and mid-vessel appear normal.  There is a  focal high-grade stenosis in the distal right coronary involving the  bifurcation of the PDA and posterolateral branch up to 90% to 95%.  There is  TIMI grade 2 flow at the PDA.   LEFT VENTRICULAR ANGIOGRAPHY:  Performed in RAO view.  This demonstrates  normal left ventricle size and contractility with normal systolic function.  Ejection fraction is estimated at 60%.  Wall motion analysis is normal.   FINAL INTERPRETATION:  1. 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  2. Normal left ventricular function.           ______________________________  Peter M. Swaziland, M.D.     PMJ/MEDQ  D:  02/13/2006  T:  02/14/2006  Job:  [id number]   cc:   Barry Dienes. Eloise Harman, M.D.  Cassell Clement, M.D.

## 2010-10-15 NOTE — Op Note (Signed)
NAMEFAVOR, HACKLER NO.:  [id number]   MEDICAL RECORD NO.:  [id number]          PATIENT TYPE:  INP   LOCATION:  2313                         FACILITY:  [facility]   PHYSICIAN:  Sheliah Plane, MD    DATE OF BIRTH:  12/21/1945   DATE OF PROCEDURE:  DATE OF DISCHARGE:                                 OPERATIVE REPORT   PREOPERATIVE DIAGNOSIS:  Coronary occlusive disease with recent angioplasty  of the right coronary artery.   POSTOPERATIVE DIAGNOSIS:  Coronary occlusive disease with recent angioplasty  of the right coronary artery.   SURGICAL PROCEDURE:  Coronary artery bypass grafting times three with left  internal mammary to the left anterior descending coronary artery, sequential  reversed saphenous vein graft to the posterior descending and posterior  lateral branches of the right coronary artery.   SURGEON:  Gerhardt.   FIRST ASSISTANT:  Donata Clay.   SECOND ASSISTANT:  Zadie Rhine, PA.   BRIEF HISTORY:  The patient is 65 year old male who had a new onset of  angina, was evaluated by Dr. Peter Swaziland including cardiac catheterization  which demonstrated a proximal 70-80% LAD lesion and a long mid 60-70%  lesion. In addition, he had a very high grade stenosis at the distal right  coronary artery at the junction of the posterior descending and posterior  lateral branches. Attempted angioplasty of the distal right coronary artery  into the posterior descending was undertaken and was difficult and of  questionable success. The patient continued to have angina, was referred for  coronary artery bypass grafting. Risks and options were discussed with the  patient who agreed and signed informed consent.   DESCRIPTION OF PROCEDURE:  With Swan-Ganz and arterial line monitors in  place, the patient underwent general endotracheal anesthesia without  incident. The skin of the chest and legs was prepped with Betadine and  draped in the usual sterile manner. Using  the Guidant endovein system, vein  was harvested from the right thigh and was of good quality and caliber.  Median sternotomy was performed. Left internal mammary artery was dissected  down. It was pedicle grafted. The distal artery was divided and had good  free flow. The pericardium was opened. Overall ventricular function appeared  preserved. The patient was systemically heparinized. The ascending aorta and  the right atrium were cannulated. An aortic root bent cardioplegia needle  was introduced in the ascending aorta. The patient was placed on  cardiopulmonary bypass at 2.4 liters per minute per meter squared. Site of  the anastomosis were selected and dissected out of the epicardium. The  patient's body temperature was cooled to 30 degrees. Aortic cross clamp was  applied. 500 mL of cold blood potassium cardioplegia was administered with  rapid diastolic arrest of the heart. Myocardial __________ temperature was  monitored throughout the cross clamp. Attention was turned first to the  posterior descending and posterior lateral branches of the right coronary  artery. The posterior descending branch was a vessel of reasonable size,  admitted a  1.5 mm probe proximally distally. The posterior lateral branch  was opened and was  a very small branch approximately 1 mm in size. Using a  Diamond type side to side anastomosis was carried out through the posterior  descending distal. The same thing was then carried to the small posterior  lateral branch. Attention was then turned to the left anterior descending  coronary artery between the mid and distal third of the vessel. The artery  was opened with some posterior wall plaque but admitted a 1.5 mm probe  distally without difficulty. Using a running 8-0 Prolene the left internal  mammary artery was anastomosed to the left anterior descending coronary  artery. With the cross clamp still in place a single punch arteriotomy was  formed on the  ascending aorta and the vein graft to the right coronary  artery was anastomosed to the ascending aorta. Prior to completion of the  anastomosis the heart was allowed to passively fill and deair the heart and  ascending aorta and graft. Aortic cross clamp was removed with total cross  clamp time of 79 minutes. The patient spontaneously converted to a sinus  rhythm. Body temperature was rewarmed to 37 degrees. The patient did have  evidence of inferior ST elevation. Each graft was carefully inspected and  flow checked with a Doppler. ST segments gradually resolved however because  of the concern for overall ventricular function a TEE was placed. Overall  the patient had good contractility without any obvious significant wall  motion abnormalities. It was felt that ST elevation was most likely due to  the very small posterior lateral branch of the right coronary artery and was  not felt that with a very small size of this vessel that redoing the graft  would be of any benefit and actually may disrupt the vessel more. The  patient remained off bypass hemodynamically stable. He was decannulated in  the usual fashion. Protamine sulfate was administered. With operative field  hemostatic, two interventricular pacing wires were applied. Graft marker was  applied. A left pleural tube and Blake mediastinal tube were left in place.  The sternum was closed with #6 stainless steel wire. The fascia was closed  with interrupted 0 Vicryl, running 3-0 Vicryl in the subcutaneous tissue, 4-  0 subcuticular stitches at the skin edges. Dry dressings were applied.  Sponge and needle count was reported as correct at the completion of the  procedure. The patient tolerated the procedure without obvious complication,  was transferred to the Surgical Intensive Care Unit for further  postoperative care.      Sheliah Plane, MD  Electronically Signed     EG/MEDQ  D:  03/21/2006  T:  03/21/2006  Job:   [id number]   cc:   Peter M. Swaziland, M.D.

## 2010-10-15 NOTE — Discharge Summary (Signed)
Kyle Baldwin, ALVIAR NO.:  [id number]   MEDICAL RECORD NO.:  [id number]          PATIENT TYPE:  INP   LOCATION:  2013                         FACILITY:  [facility]   PHYSICIAN:  Sheliah Plane, MD    DATE OF BIRTH:  1945-12-06   DATE OF ADMISSION:  03/20/2006  DATE OF DISCHARGE:  03/24/2006                                 DISCHARGE SUMMARY   PRIMARY DIAGNOSIS:  Coronary occlusive disease with recent angioplasty of  the right coronary artery.   IN-HOSPITAL DIAGNOSIS:  Acute blood loss anemia postoperatively.   SECONDARY DIAGNOSES:  1. History of bladder carcinoma in situ, treated by Dr. Aldean Ast  2. Status post back surgery.  3. Hypertension.  4. Hyperlipidemia.   ALLERGIES:  Patient is allergic to CODEINE and IMDUR.   IN-HOSPITAL OPERATIONS AND PROCEDURES:  Coronary artery bypass grafting x3  using a left internal mammary artery to left anterior descending coronary  artery, sequential reversed saphenous vein graft to posterior descending and  posterolateral branches of the right coronary artery.   HISTORY AND PHYSICAL AND HOSPITAL COURSE:  The patient is a 65 year old male  who had new-onset angina and was evaluated by Dr. Swaziland.  He underwent  cardiac catheterization, which demonstrated proximal 70% to 80% LAD lesion  and a long mid 60% to 70% lesion.  In addition, he had a very high-grade  stenosis at the distal right coronary artery at the junction of posterior  descending and posterolateral branches.  Attempted angioplasty of the distal  right coronary artery was made and to the posterior descending was  undertaken, which __________ .  Following this, the patient continued to  have chest pain.  Dr. Tyrone Sage was consulted.  the patient was seen and  evaluated by Dr. Tyrone Sage.  Dr. Tyrone Sage discussed with the patient  undergoing coronary artery bypass grafting.  He discussed the risks and  benefits of this procedure with the patient.  The patient  acknowledged  understanding and agreed to proceed.  Surgery was scheduled for March 20, 2006.  For details of the patient's past medical history and physical exam,  please see dictated history and physical.   The patient was taken to the operating room on March 20, 2006, where he  underwent coronary bypass grafting x3 using a left internal mammary artery  to the left anterior descending coronary artery, sequential reversed  saphenous vein graft to the posterior descending and posterolateral branches  of the right coronary artery.  The patient tolerated this procedure well and  was transferred to the intensive care unit in stable condition.  The  patient's postoperative course was essentially remarkable.  Postop day #1,  the patient was weaned off dopamine and amiodarone.  Blood pressure remained  stable.  He was out of bed to chair.  Followup EKG was done, which showed no  acute changes..  By postop day #2, the patient's chest tubes and lines were  discontinued.  He was out of bed, ambulating well.  He did have some nausea  from the morphine.  Morphine was discontinued and nausea improved.  He was  tolerating a regular diet well.  The patient was transferred out to 2000 on  postop day #2.  The patient did develop acute blood loss anemia with  hemoglobin and hematocrit dropping to 10.1 and 28.9.  These remained stable.  The patient was asymptomatic.  During his hospital course, vital signs were  monitored and seemed to be stable.  The patient was able to be weaned off  oxygen, saturating greater than 90% on room air.  He remained afebrile.  The  patient remained in normal sinus rhythm postoperatively.  Postoperative  chest x-ray was stable, showing no pneumonia or pneumothorax.  Incisions  were clean, dry and intact and healing well.  Again, the patient was out of  bed, ambulating well.  He was tolerating regular diet well.  Last lab work  was obtained on postop day #3, March 23, 2006.  This showed a white blood  cell count of 7.3, hemoglobin 10.1, hematocrit 28.9 and platelet count of  108,000, sodium of 140, potassium of 3.9, BUN of 14, creatinine 0.9 and  glucose of 116.   The patient is tentatively ready for discharge home postop day #4, March 24 2006.   FOLLOWUP APPOINTMENTS:  A followup appointment will be arranged with Dr.  Tyrone Sage in 3 weeks.  Our office will contact the patient with this  appointment date and time.  The patient will need to contact Dr. Elvis Coil  office to schedule a followup appointment him in 2 weeks.   ACTIVITY:  The patient was instructed in no driving until released to do so,  no lifting over 10 pounds.  He is told to ambulate 3-4 times per day,  progress as tolerated and to continue his breathing exercises.   INCISIONAL CARE:  The patient was told to shower, washing his incisions  using soap and water.  He is to contact the office if he develops any  drainage or opening from any of his incision sites.   DIET:  The patient was educated on diet to be low-fat, low-salt.   DISCHARGE MEDICATIONS:  1. Toprol-XL 25 mg daily.  2. Lipitor 20 mg at night.  3. Zantac as needed.  4. Oxycodone 5 mg one to two tablets q.4-6 h. p.r.n. pain.      Theda Belfast, [place]      Sheliah Plane, MD  Electronically Signed    KMD/MEDQ  D:  03/23/2006  T:  03/24/2006  Job:  [id number]   cc:   Peter M. Swaziland, M.D.

## 2011-03-14 LAB — URINALYSIS, ROUTINE W REFLEX MICROSCOPIC
Glucose, UA: NEGATIVE
Nitrite: NEGATIVE
Specific Gravity, Urine: 1.025
pH: 6

## 2011-03-14 LAB — BASIC METABOLIC PANEL
Chloride: 107
GFR calc Af Amer: >60
GFR calc non Af Amer: >60
Potassium: 4.1

## 2011-03-14 LAB — CBC
HCT: 41.7
MCV: 86.6
RBC: 4.81
WBC: 5.8

## 2012-02-15 ENCOUNTER — Encounter: Payer: Self-pay | Admitting: Internal Medicine

## 2012-02-15 ENCOUNTER — Ambulatory Visit (AMBULATORY_SURGERY_CENTER): Payer: BC Managed Care – PPO | Admitting: *Deleted

## 2012-02-15 VITALS — Ht 72.0 in | Wt 235.0 lb

## 2012-02-15 DIAGNOSIS — Z1211 Encounter for screening for malignant neoplasm of colon: Secondary | ICD-10-CM

## 2012-02-15 MED ORDER — MOVIPREP 100 G PO SOLR: ORAL | Status: DC

## 2012-02-29 ENCOUNTER — Encounter: Payer: Self-pay | Admitting: Internal Medicine

## 2012-02-29 ENCOUNTER — Ambulatory Visit (AMBULATORY_SURGERY_CENTER): Payer: BC Managed Care – PPO | Admitting: Internal Medicine

## 2012-02-29 VITALS — BP 123/80 | HR 80 | Temp 98.2°F | Resp 18 | Ht 72.0 in | Wt 235.0 lb

## 2012-02-29 DIAGNOSIS — Z1211 Encounter for screening for malignant neoplasm of colon: Secondary | ICD-10-CM

## 2012-02-29 MED ORDER — SODIUM CHLORIDE 0.9 % IV SOLN: 500.0000 mL | INTRAVENOUS | Status: DC

## 2012-02-29 NOTE — Progress Notes (Signed)
Patient did not experience any of the following events: a burn prior to discharge; a fall within the facility; wrong site/side/patient/procedure/implant event; or a hospital transfer or hospital admission upon discharge from the facility. (G8907) Patient did not have preoperative order for IV antibiotic SSI prophylaxis. (G8918)  

## 2012-02-29 NOTE — Op Note (Signed)
[facility] [address] [place], [address]   COLONOSCOPY PROCEDURE REPORT  PATIENT: Kyle Baldwin, Kyle Baldwin  MR#: [id number] BIRTHDATE: 1945-08-05 , 66  yrs. old GENDER: Male ENDOSCOPIST: Roxy Cedar, MD REFERRED XB:JYNWGN Eloise Harman, M.D. PROCEDURE DATE:  02/29/2012 PROCEDURE:   Colonoscopy, screening ASA CLASS:   Class II INDICATIONS:average risk screening. MEDICATIONS: MAC sedation, administered by CRNA and propofol (Diprivan) 200mg  IV  DESCRIPTION OF PROCEDURE:   After the risks benefits and alternatives of the procedure were thoroughly explained, informed consent was obtained.  A digital rectal exam revealed no abnormalities of the rectum.   The LB CF-[id number]  endoscope was introduced through the anus and advanced to the cecum, which was identified by both the appendix and ileocecal valve. No adverse events experienced.   The quality of the prep was good, using MoviPrep  The instrument was then slowly withdrawn as the colon was fully examined.      COLON FINDINGS: Moderate diverticulosis was noted in the sigmoid colon.   The colon was otherwise normal.  There was no diverticulosis, inflammation, polyps or cancers unless previously stated.  Retroflexed views revealed no abnormalities. The time to cecum=1 minutes 56 seconds.  Withdrawal time=11 minutes 37 seconds. The scope was withdrawn and the procedure completed. COMPLICATIONS: There were no complications.  ENDOSCOPIC IMPRESSION: 1.   Moderate diverticulosis was noted in the sigmoid colon 2.   The colon was otherwise normal  RECOMMENDATIONS: 1. Continue current colorectal screening recommendations for "routine risk" patients with a repeat colonoscopy in 10 years.   eSigned:  Roxy Cedar, MD 02/29/2012 9:53 AM   cc: Jarome Matin, MD and The Patient

## 2012-02-29 NOTE — Patient Instructions (Signed)
YOU HAD AN ENDOSCOPIC PROCEDURE TODAY AT THE [facility]: Refer to the procedure report that was given to you for any specific questions about what was found during the examination.  If the procedure report does not answer your questions, please call your gastroenterologist to clarify.  If you requested that your care partner not be given the details of your procedure findings, then the procedure report has been included in a sealed envelope for you to review at your convenience later.  YOU SHOULD EXPECT: Some feelings of bloating in the abdomen. Passage of more gas than usual.  Walking can help get rid of the air that was put into your GI tract during the procedure and reduce the bloating. If you had a lower endoscopy (such as a colonoscopy or flexible sigmoidoscopy) you may notice spotting of blood in your stool or on the toilet paper. If you underwent a bowel prep for your procedure, then you may not have a normal bowel movement for a few days.  DIET: Your first meal following the procedure should be a light meal and then it is ok to progress to your normal diet.  A half-sandwich or bowl of soup is an example of a good first meal.  Heavy or fried foods are harder to digest and may make you feel nauseous or bloated.  Likewise meals heavy in dairy and vegetables can cause extra gas to form and this can also increase the bloating.  Drink plenty of fluids but you should avoid alcoholic beverages for 24 hours.  ACTIVITY: Your care partner should take you home directly after the procedure.  You should plan to take it easy, moving slowly for the rest of the day.  You can resume normal activity the day after the procedure however you should NOT DRIVE or use heavy machinery for 24 hours (because of the sedation medicines used during the test).    SYMPTOMS TO REPORT IMMEDIATELY: A gastroenterologist can be reached at any hour.  During normal business hours, 8:30 AM to 5:00 PM Monday through Friday,  call [phone].  After hours and on weekends, please call the GI answering service at [phone] who will take a message and have the physician on call contact you.   Following lower endoscopy (colonoscopy or flexible sigmoidoscopy):  Excessive amounts of blood in the stool  Significant tenderness or worsening of abdominal pains  Swelling of the abdomen that is new, acute  Fever of 100F or higher    FOLLOW UP: If any biopsies were taken you will be contacted by phone or by letter within the next 1-3 weeks.  Call your gastroenterologist if you have not heard about the biopsies in 3 weeks.  Our staff will call the home number listed on your records the next business day following your procedure to check on you and address any questions or concerns that you may have at that time regarding the information given to you following your procedure. This is a courtesy call and so if there is no answer at the home number and we have not heard from you through the emergency physician on call, we will assume that you have returned to your regular daily activities without incident.  SIGNATURES/CONFIDENTIALITY: You and/or your care partner have signed paperwork which will be entered into your electronic medical record.  These signatures attest to the fact that that the information above on your After Visit Summary has been reviewed and is understood.  Full responsibility of the confidentiality   of this discharge information lies with you and/or your care-partner.     

## 2012-03-01 ENCOUNTER — Telehealth: Payer: Self-pay

## 2012-03-01 NOTE — Telephone Encounter (Signed)
Left a message on the pt's answering machine at # [phone] to call if any questions or concerns. Maw

## 2012-03-24 ENCOUNTER — Encounter (HOSPITAL_COMMUNITY): Payer: Self-pay

## 2012-03-24 ENCOUNTER — Emergency Department (HOSPITAL_COMMUNITY)
Admission: EM | Admit: 2012-03-24 | Discharge: 2012-03-24 | Disposition: A | Discharge status: Home / Self Care | Payer: BC Managed Care – PPO | Attending: Emergency Medicine | Admitting: Emergency Medicine

## 2012-03-24 ENCOUNTER — Emergency Department (HOSPITAL_COMMUNITY): Payer: BC Managed Care – PPO

## 2012-03-24 DIAGNOSIS — K219 Gastro-esophageal reflux disease without esophagitis: Secondary | ICD-10-CM | POA: Insufficient documentation

## 2012-03-24 DIAGNOSIS — R110 Nausea: Secondary | ICD-10-CM | POA: Insufficient documentation

## 2012-03-24 DIAGNOSIS — Z951 Presence of aortocoronary bypass graft: Secondary | ICD-10-CM | POA: Insufficient documentation

## 2012-03-24 DIAGNOSIS — Z7982 Long term (current) use of aspirin: Secondary | ICD-10-CM | POA: Insufficient documentation

## 2012-03-24 DIAGNOSIS — E785 Hyperlipidemia, unspecified: Secondary | ICD-10-CM | POA: Insufficient documentation

## 2012-03-24 DIAGNOSIS — Z79899 Other long term (current) drug therapy: Secondary | ICD-10-CM | POA: Insufficient documentation

## 2012-03-24 DIAGNOSIS — I252 Old myocardial infarction: Secondary | ICD-10-CM | POA: Insufficient documentation

## 2012-03-24 DIAGNOSIS — Z8551 Personal history of malignant neoplasm of bladder: Secondary | ICD-10-CM | POA: Insufficient documentation

## 2012-03-24 DIAGNOSIS — I2510 Atherosclerotic heart disease of native coronary artery without angina pectoris: Secondary | ICD-10-CM | POA: Insufficient documentation

## 2012-03-24 DIAGNOSIS — R61 Generalized hyperhidrosis: Secondary | ICD-10-CM | POA: Insufficient documentation

## 2012-03-24 DIAGNOSIS — M542 Cervicalgia: Secondary | ICD-10-CM | POA: Insufficient documentation

## 2012-03-24 DIAGNOSIS — I10 Essential (primary) hypertension: Secondary | ICD-10-CM | POA: Insufficient documentation

## 2012-03-24 DIAGNOSIS — R42 Dizziness and giddiness: Secondary | ICD-10-CM | POA: Insufficient documentation

## 2012-03-24 LAB — CBC WITH DIFFERENTIAL/PLATELET
Eosinophils Absolute: 0 10*3/uL (ref 0.0–0.7)
Eosinophils Relative: 1 % (ref 0–5)
HCT: 37.2 % — ABNORMAL LOW (ref 39.0–52.0)
Lymphocytes Relative: 15 % (ref 12–46)
Lymphs Abs: 1 10*3/uL (ref 0.7–4.0)
MCH: 30.7 pg (ref 26.0–34.0)
MCV: 85.9 fL (ref 78.0–100.0)
Monocytes Absolute: 0.4 10*3/uL (ref 0.1–1.0)
Monocytes Relative: 6 % (ref 3–12)
Platelets: 130 10*3/uL — ABNORMAL LOW (ref 150–400)
RBC: 4.33 MIL/uL (ref 4.22–5.81)
WBC: 6.6 10*3/uL (ref 4.0–10.5)

## 2012-03-24 LAB — BASIC METABOLIC PANEL
CO2: 28 mEq/L (ref 19–32)
Calcium: 8.7 mg/dL (ref 8.4–10.5)
Chloride: 108 mEq/L (ref 96–112)
Glucose, Bld: 117 mg/dL — ABNORMAL HIGH (ref 70–99)
Sodium: 140 mEq/L (ref 135–145)

## 2012-03-24 LAB — TROPONIN I: Troponin I: <0.3 ng/mL (ref <0.30)

## 2012-03-24 MED ORDER — MECLIZINE HCL 50 MG PO TABS: 50.0000 mg | ORAL_TABLET | Freq: Three times a day (TID) | ORAL | Status: DC | PRN

## 2012-03-24 MED ORDER — DIAZEPAM 5 MG PO TABS: 5.0000 mg | ORAL_TABLET | Freq: Two times a day (BID) | ORAL | Status: DC

## 2012-03-24 MED ORDER — MECLIZINE HCL 25 MG PO TABS
25.0000 mg | ORAL_TABLET | Freq: Once | ORAL | Status: AC
  Administered 2012-03-24: 25 mg via ORAL
  Filled 2012-03-24: qty 1

## 2012-03-24 NOTE — ED Notes (Signed)
Pt with hx of MI and CABG reports he woke up this am with upper chest pain/neck pain associated with SOB, diaphoresis, and nausea.  Pt reports this is how he felt when he had his MI and subsequent CABG.  Pt took 325 mg ASA before EMS arrival.  EMS gave IV Zofran.

## 2012-03-24 NOTE — ED Provider Notes (Signed)
1:18 PM Patient with a hx sig for previous MI and CABG who presents with neck pain and upper chest pain with associated nausea and diaphoresis was placed in CDU on observation by Dr. Weldon Inches. Patient care resumed from Dr. Weldon Inches .  Patient is here for troponin results and has received Meclizine for vertigo. While in obeservation had no complaints, per nursing staff. Patient re-evaluated and is resting comfortable, VSS, with no new complaints or concerns at this time. Plan per previous provider is to disposition the patient based on troponin result. On exam: hemodynamically stable, NAD, heart w/ RRR, lungs CTAB, Chest & abd non-tender, no peripheral edema or calf tenderness. Patient reports dizziness made worse with head movement.   Patient's troponin is negative. He can be discharged with Meclizine and Valium for vertigo and follow up with PCP. Patient is informed of results and is agreeable to plan. No further evaluation needed at this time.      Kyle Beck, PA-C 03/24/12 1342

## 2012-03-24 NOTE — ED Provider Notes (Addendum)
History     CSN: [id number]  Arrival date & time 03/24/12  4098   First MD Initiated Contact with Patient 03/24/12 [phone]      Chief Complaint  Patient presents with  . Chest Pain    (Consider location/radiation/quality/duration/timing/severity/associated sxs/prior treatment) The history is provided by the patient.   66 year old, male, with a history of cardiac bypass, presents emergency department complaining of nausea yesterday.  This morning.  He developed nausea, dizziness, and diaphoresis associated with pain in his bilateral jaw area.  He denied pain in his chest.  He did not have shortness of breath.  He took an aspirin and his symptoms have now resolved.  The patient denies recent illness.  He has not had a sore throat, congestion, or ear aches.  He has not had a cough, vomiting, or diarrhea.  Presently, he is asymptomatic.  Past Medical History  Diagnosis Date  . Myocardial infarction 2007    heart bypass 2007  . Hypertension   . CAD (coronary artery disease)   . Hyperlipidemia   . GERD (gastroesophageal reflux disease)   . Cancer 2005    bladder cancer in situ 2005  . Bilateral carpal tunnel syndrome     Past Surgical History  Procedure Date  . Heart bypass 2007    3 vessels  . Lumbar laminectomy   . Bladder surgery 2005    Family History  Problem Relation Age of Onset  . Colon cancer Maternal Grandfather 2  . Stomach cancer Neg Hx     History  Substance Use Topics  . Smoking status: Never Smoker   . Smokeless tobacco: Never Used  . Alcohol Use: No      Review of Systems  Constitutional: Positive for diaphoresis. Negative for fever and chills.  HENT: Positive for neck pain. Negative for congestion.        Neck pain.  Occurred.  This morning with nausea, dizziness, and diaphoresis.  Presently, it is resolved  Eyes: Negative for visual disturbance.  Respiratory: Negative for cough, chest tightness and shortness of breath.   Cardiovascular: Negative  for chest pain.  Gastrointestinal: Positive for nausea. Negative for vomiting, abdominal pain and diarrhea.  Neurological: Positive for dizziness. Negative for headaches.  Psychiatric/Behavioral: Negative for confusion.  All other systems reviewed and are negative.    Allergies  Codeine and Morphine and related  Home Medications   Current Outpatient Rx  Name Route Sig Dispense Refill  . ASPIRIN 325 MG PO TABS Oral Take 325 mg by mouth daily.    . CYANOCOBALAMIN 1000 MCG PO TABS Oral Take 1,000 mcg by mouth daily.     [name] LOSARTAN POTASSIUM 100 MG PO TABS Oral Take 100 mg by mouth daily.     [name] METOPROLOL TARTRATE 25 MG PO TABS Oral Take 25 mg by mouth daily.     [name] OMEPRAZOLE 20 MG PO CPDR Oral Take 20 mg by mouth daily as needed. For indigestion    . SIMVASTATIN 20 MG PO TABS Oral Take 20 mg by mouth daily.       BP 137/80  Pulse 70  Temp 98.2 F (36.8 C) (Oral)  Resp 20  SpO2 95%  Physical Exam  Nursing note and vitals reviewed. Constitutional: He is oriented to person, place, and time. He appears well-developed and well-nourished. No distress.  HENT:  Head: Normocephalic and atraumatic.  Right Ear: External ear normal.  Left Ear: External ear normal.  Eyes: Conjunctivae normal and EOM are normal.  Neck: Normal range of motion. Neck supple. No JVD present.       No carotid bruits  Cardiovascular: Normal rate, regular rhythm and intact distal pulses.   No murmur heard. Pulmonary/Chest: Effort normal and breath sounds normal.  Abdominal: Soft. Bowel sounds are normal. He exhibits no distension. There is no tenderness. There is no guarding.  Musculoskeletal: Normal range of motion. He exhibits no edema and no tenderness.  Neurological: He is alert and oriented to person, place, and time. No cranial nerve deficit.  Skin: Skin is warm and dry.  Psychiatric: He has a normal mood and affect. Thought content normal.    ED Course  Procedures (including critical care  time)  While I was examining the patient.  He said that he had another episode of the brief,transient dizziness.  However, when I examined his years again, and asked him to turn his head.  From side to side.  The dizziness did not recur  Labs Reviewed - No data to display No results found.   No diagnosis found.  ECG. Normal sinus rhythm at 65 beats per minute. Normal axis. Normal intervals  Normal.  ST and T-wave  Pt HAD dizziness, nausea, sweating, and neck pain. Brief. NEVER had cp.  sxs resolved. Has hx of cabg.  Will check single trop.  I have low suspicion for acs.  If pos. Will admit.  If neg will release to f/u with pcp.   MDM  Dizziness and nausea        Cheri Guppy, MD 03/24/12 1610  Cheri Guppy, MD 03/24/12 9604  Cheri Guppy, MD 03/24/12 1014

## 2012-03-24 NOTE — ED Notes (Signed)
PA HAS BEEN IN TO ANSWER QUESTIONS AND GIVE PT DISCHARGE PLAN.

## 2012-03-24 NOTE — ED Provider Notes (Signed)
Medical screening examination/treatment/procedure(s) were conducted as a shared visit with non-physician practitioner(s) and myself.  I personally evaluated the patient during the encounter  Shamaya Kauer, MD 03/24/12 1603 

## 2012-07-18 DIAGNOSIS — K219 Gastro-esophageal reflux disease without esophagitis: Secondary | ICD-10-CM | POA: Diagnosis not present

## 2012-07-18 DIAGNOSIS — I259 Chronic ischemic heart disease, unspecified: Secondary | ICD-10-CM | POA: Diagnosis not present

## 2012-07-18 DIAGNOSIS — I10 Essential (primary) hypertension: Secondary | ICD-10-CM | POA: Diagnosis not present

## 2012-11-15 DIAGNOSIS — R490 Dysphonia: Secondary | ICD-10-CM | POA: Diagnosis not present

## 2012-11-15 DIAGNOSIS — R05 Cough: Secondary | ICD-10-CM | POA: Diagnosis not present

## 2012-11-15 DIAGNOSIS — R110 Nausea: Secondary | ICD-10-CM | POA: Diagnosis not present

## 2012-11-15 DIAGNOSIS — Z6830 Body mass index (BMI) 30.0-30.9, adult: Secondary | ICD-10-CM | POA: Diagnosis not present

## 2012-11-15 DIAGNOSIS — R634 Abnormal weight loss: Secondary | ICD-10-CM | POA: Diagnosis not present

## 2012-11-20 DIAGNOSIS — R110 Nausea: Secondary | ICD-10-CM | POA: Diagnosis not present

## 2012-11-20 DIAGNOSIS — R634 Abnormal weight loss: Secondary | ICD-10-CM | POA: Diagnosis not present

## 2012-11-22 DIAGNOSIS — R070 Pain in throat: Secondary | ICD-10-CM | POA: Diagnosis not present

## 2012-11-22 DIAGNOSIS — R490 Dysphonia: Secondary | ICD-10-CM | POA: Diagnosis not present

## 2012-11-29 ENCOUNTER — Encounter: Payer: Self-pay | Admitting: Internal Medicine

## 2012-12-19 ENCOUNTER — Ambulatory Visit (INDEPENDENT_AMBULATORY_CARE_PROVIDER_SITE_OTHER): Payer: Medicare Other | Admitting: Internal Medicine

## 2012-12-19 ENCOUNTER — Encounter: Payer: Self-pay | Admitting: Internal Medicine

## 2012-12-19 VITALS — BP 110/72 | HR 80 | Ht 71.0 in | Wt 223.2 lb

## 2012-12-19 DIAGNOSIS — K760 Fatty (change of) liver, not elsewhere classified: Secondary | ICD-10-CM

## 2012-12-19 DIAGNOSIS — R932 Abnormal findings on diagnostic imaging of liver and biliary tract: Secondary | ICD-10-CM

## 2012-12-19 DIAGNOSIS — K219 Gastro-esophageal reflux disease without esophagitis: Secondary | ICD-10-CM

## 2012-12-19 DIAGNOSIS — R110 Nausea: Secondary | ICD-10-CM

## 2012-12-19 DIAGNOSIS — R141 Gas pain: Secondary | ICD-10-CM

## 2012-12-19 DIAGNOSIS — R143 Flatulence: Secondary | ICD-10-CM | POA: Diagnosis not present

## 2012-12-19 DIAGNOSIS — R140 Abdominal distension (gaseous): Secondary | ICD-10-CM

## 2012-12-19 DIAGNOSIS — K7689 Other specified diseases of liver: Secondary | ICD-10-CM

## 2012-12-19 DIAGNOSIS — R142 Eructation: Secondary | ICD-10-CM | POA: Diagnosis not present

## 2012-12-19 MED ORDER — SACCHAROMYCES BOULARDII 250 MG PO CAPS: 250.0000 mg | ORAL_CAPSULE | Freq: Two times a day (BID) | ORAL | Status: DC

## 2012-12-19 NOTE — Patient Instructions (Addendum)
We have given you samples of Florastor. This puts good bacteria back into your colon. You should take 1 capsule by mouth once daily. If this works well for you, it can be purchased over the counter.

## 2012-12-19 NOTE — Progress Notes (Signed)
HISTORY OF PRESENT ILLNESS:  Kyle Baldwin is a 67 y.o. male  who is sent today by Dr. Eloise Harman regarding abnormal liver ultrasound. As well, complaints of nausea, sore throat, and GERD. The patient reports problems with a constant "sore throat" for about 1 year. Actually, what he describes as soreness of the anterior neck muscles. He was evaluated by an ear nose and throat specialist about 3 weeks ago. He reports a negative workup. He was changed from when necessary Zantac (which he takes for chronic intermittent GERD) tube omeprazole 40 mg twice a day. He states that this helped reflux symptoms but not to sore neck. He also reports a 6 month history of vague nausea without vomiting. No change in that symptom on omeprazole. He denies dysphagia. His other complaint is increased intestinal gas as manifested by bloating and flatus. Because of complaints of nausea patient underwent an abdominal ultrasound 11/20/2012. This revealed changes in the liver consistent with fatty liver. The patient is a BMI of 31 visit no change in weight over the past 20 years. Review of outside blood work from June 2014 reveals entirely normal liver tests and normal TSH. Aside from omeprazole, he is on no new medications. He did undergo screening colonoscopy October 2013. This revealed sigmoid diverticulosis only.  REVIEW OF SYSTEMS:  All non-GI ROS negative except for back pain, cough, sore throat, with changes   Past Medical History  Diagnosis Date  . Myocardial infarction 2007    heart bypass 2007  . Hypertension   . CAD (coronary artery disease)   . Hyperlipidemia   . GERD (gastroesophageal reflux disease)   . Bladder cancer 2005    bladder cancer in situ 2005  . Bilateral carpal tunnel syndrome   . Vitamin B 12 deficiency   . Diverticulosis   . Kidney stones   . Fatty liver   . Liver cyst right    hepatic lobe cyst    Past Surgical History  Procedure Laterality Date  . Heart bypass  2007    3 vessels  .  Lumbar laminectomy    . Bladder surgery  2005    Social History Kyle Baldwin  reports that he quit smoking about 44 years ago. His smoking use included Cigarettes. He smoked 0.00 packs per day. He has never used smokeless tobacco. He reports that he does not drink alcohol or use illicit drugs.  family history includes Colon cancer (age of onset: 33) in his maternal grandfather; Diabetes in his maternal grandmother; and Heart disease in his maternal grandmother.  There is no history of Stomach cancer.  Allergies  Allergen Reactions  . Codeine Nausea And Vomiting  . Morphine And Related Nausea And Vomiting       PHYSICAL EXAMINATION: Vital signs: BP 110/72  Pulse 80  Ht 5\' 11"  (1.803 m)  Wt 223 lb 4 oz (101.266 kg)  BMI 31.15 kg/m2 General: Well-developed, well-nourished, no acute distress HEENT: Sclerae are anicteric, conjunctiva pink. Oral mucosa intact Lungs: Clear Heart: Regular Abdomen: soft, nontender, nondistended, no obvious ascites, no peritoneal signs, normal bowel sounds. No organomegaly. Extremities: No edema Psychiatric: alert and oriented x3. Cooperative   ASSESSMENT:  #1. Abnormal hepatic echo pattern consistent with fatty liver. I suspect that this is indeed benign fatty liver in a gentleman with a BMI of 31. However, entirely normal liver tests and no clinical evidence for liver disease. No further workup indicated. Reassurance provided #2. GERD. Classic symptoms controlled with current PPI dose #3. Neck  discomfort described as "sore throat". Most certainly musculoskeletal. Negative ENT evaluation. No response to PPI #4. Mild nausea. Etiology unclear. Maybe GERD equivalent. Could be medication reaction #5. Increased intestinal gas. No alarm features #6. Colon cancer screening. Up-to-date. Diverticulosis only in 2013   PLAN:  #1. Discussion regarding fatty liver. #2. Continue twice a day PPI for 2 weeks then decrease to once daily #3. Reflux  precautions #4. Probiotic samples x8 days to see if this helps with gas #5. Literature on intestinal gas as well as anti-gas and flatulence dietary sheet provided for his review #6. Return to the care of Dr. Eloise Harman. GI followup as needed

## 2012-12-28 DIAGNOSIS — I259 Chronic ischemic heart disease, unspecified: Secondary | ICD-10-CM | POA: Diagnosis not present

## 2012-12-28 DIAGNOSIS — R490 Dysphonia: Secondary | ICD-10-CM | POA: Diagnosis not present

## 2012-12-28 DIAGNOSIS — Z1331 Encounter for screening for depression: Secondary | ICD-10-CM | POA: Diagnosis not present

## 2012-12-28 DIAGNOSIS — R110 Nausea: Secondary | ICD-10-CM | POA: Diagnosis not present

## 2012-12-28 DIAGNOSIS — K219 Gastro-esophageal reflux disease without esophagitis: Secondary | ICD-10-CM | POA: Diagnosis not present

## 2012-12-28 DIAGNOSIS — K7689 Other specified diseases of liver: Secondary | ICD-10-CM | POA: Diagnosis not present

## 2012-12-28 DIAGNOSIS — R634 Abnormal weight loss: Secondary | ICD-10-CM | POA: Diagnosis not present

## 2013-01-17 DIAGNOSIS — E538 Deficiency of other specified B group vitamins: Secondary | ICD-10-CM | POA: Diagnosis not present

## 2013-01-17 DIAGNOSIS — R7309 Other abnormal glucose: Secondary | ICD-10-CM | POA: Diagnosis not present

## 2013-01-17 DIAGNOSIS — I10 Essential (primary) hypertension: Secondary | ICD-10-CM | POA: Diagnosis not present

## 2013-01-17 DIAGNOSIS — Z125 Encounter for screening for malignant neoplasm of prostate: Secondary | ICD-10-CM | POA: Diagnosis not present

## 2013-01-17 DIAGNOSIS — E785 Hyperlipidemia, unspecified: Secondary | ICD-10-CM | POA: Diagnosis not present

## 2013-01-23 DIAGNOSIS — R070 Pain in throat: Secondary | ICD-10-CM | POA: Diagnosis not present

## 2013-01-24 DIAGNOSIS — Z0000 Encounter for general adult medical examination without abnormal findings: Secondary | ICD-10-CM | POA: Diagnosis not present

## 2013-01-24 DIAGNOSIS — E785 Hyperlipidemia, unspecified: Secondary | ICD-10-CM | POA: Diagnosis not present

## 2013-01-24 DIAGNOSIS — K219 Gastro-esophageal reflux disease without esophagitis: Secondary | ICD-10-CM | POA: Diagnosis not present

## 2013-01-24 DIAGNOSIS — Z23 Encounter for immunization: Secondary | ICD-10-CM | POA: Diagnosis not present

## 2013-01-24 DIAGNOSIS — I259 Chronic ischemic heart disease, unspecified: Secondary | ICD-10-CM | POA: Diagnosis not present

## 2013-01-24 DIAGNOSIS — R490 Dysphonia: Secondary | ICD-10-CM | POA: Diagnosis not present

## 2013-01-24 DIAGNOSIS — I10 Essential (primary) hypertension: Secondary | ICD-10-CM | POA: Diagnosis not present

## 2013-01-24 DIAGNOSIS — E538 Deficiency of other specified B group vitamins: Secondary | ICD-10-CM | POA: Diagnosis not present

## 2013-01-24 DIAGNOSIS — M79609 Pain in unspecified limb: Secondary | ICD-10-CM | POA: Diagnosis not present

## 2013-01-25 DIAGNOSIS — Z1212 Encounter for screening for malignant neoplasm of rectum: Secondary | ICD-10-CM | POA: Diagnosis not present

## 2013-03-25 DIAGNOSIS — R490 Dysphonia: Secondary | ICD-10-CM | POA: Diagnosis not present

## 2013-03-25 DIAGNOSIS — R070 Pain in throat: Secondary | ICD-10-CM | POA: Diagnosis not present

## 2013-03-25 DIAGNOSIS — K219 Gastro-esophageal reflux disease without esophagitis: Secondary | ICD-10-CM | POA: Diagnosis not present

## 2013-06-04 DIAGNOSIS — J209 Acute bronchitis, unspecified: Secondary | ICD-10-CM | POA: Diagnosis not present

## 2013-06-04 DIAGNOSIS — R059 Cough, unspecified: Secondary | ICD-10-CM | POA: Diagnosis not present

## 2013-06-04 DIAGNOSIS — R05 Cough: Secondary | ICD-10-CM | POA: Diagnosis not present

## 2013-06-04 DIAGNOSIS — Z6831 Body mass index (BMI) 31.0-31.9, adult: Secondary | ICD-10-CM | POA: Diagnosis not present

## 2014-01-24 DIAGNOSIS — R7309 Other abnormal glucose: Secondary | ICD-10-CM | POA: Diagnosis not present

## 2014-01-24 DIAGNOSIS — E538 Deficiency of other specified B group vitamins: Secondary | ICD-10-CM | POA: Diagnosis not present

## 2014-01-24 DIAGNOSIS — I10 Essential (primary) hypertension: Secondary | ICD-10-CM | POA: Diagnosis not present

## 2014-01-24 DIAGNOSIS — E785 Hyperlipidemia, unspecified: Secondary | ICD-10-CM | POA: Diagnosis not present

## 2014-01-24 DIAGNOSIS — Z125 Encounter for screening for malignant neoplasm of prostate: Secondary | ICD-10-CM | POA: Diagnosis not present

## 2014-01-31 DIAGNOSIS — Z1331 Encounter for screening for depression: Secondary | ICD-10-CM | POA: Diagnosis not present

## 2014-01-31 DIAGNOSIS — Z0000 Encounter for general adult medical examination without abnormal findings: Secondary | ICD-10-CM | POA: Diagnosis not present

## 2014-01-31 DIAGNOSIS — Z1212 Encounter for screening for malignant neoplasm of rectum: Secondary | ICD-10-CM | POA: Diagnosis not present

## 2014-01-31 DIAGNOSIS — I259 Chronic ischemic heart disease, unspecified: Secondary | ICD-10-CM | POA: Diagnosis not present

## 2014-01-31 DIAGNOSIS — M545 Low back pain, unspecified: Secondary | ICD-10-CM | POA: Diagnosis not present

## 2014-01-31 DIAGNOSIS — R209 Unspecified disturbances of skin sensation: Secondary | ICD-10-CM | POA: Diagnosis not present

## 2014-01-31 DIAGNOSIS — I10 Essential (primary) hypertension: Secondary | ICD-10-CM | POA: Diagnosis not present

## 2014-01-31 DIAGNOSIS — M79609 Pain in unspecified limb: Secondary | ICD-10-CM | POA: Diagnosis not present

## 2014-01-31 DIAGNOSIS — E785 Hyperlipidemia, unspecified: Secondary | ICD-10-CM | POA: Diagnosis not present

## 2014-01-31 DIAGNOSIS — R7309 Other abnormal glucose: Secondary | ICD-10-CM | POA: Diagnosis not present

## 2014-02-28 DIAGNOSIS — M65341 Trigger finger, right ring finger: Secondary | ICD-10-CM | POA: Diagnosis not present

## 2014-02-28 DIAGNOSIS — G5602 Carpal tunnel syndrome, left upper limb: Secondary | ICD-10-CM | POA: Diagnosis not present

## 2014-02-28 DIAGNOSIS — G5601 Carpal tunnel syndrome, right upper limb: Secondary | ICD-10-CM | POA: Diagnosis not present

## 2014-03-08 DIAGNOSIS — G5601 Carpal tunnel syndrome, right upper limb: Secondary | ICD-10-CM | POA: Diagnosis not present

## 2014-03-08 DIAGNOSIS — G5602 Carpal tunnel syndrome, left upper limb: Secondary | ICD-10-CM | POA: Diagnosis not present

## 2014-03-11 DIAGNOSIS — Z23 Encounter for immunization: Secondary | ICD-10-CM | POA: Diagnosis not present

## 2014-03-14 DIAGNOSIS — G5601 Carpal tunnel syndrome, right upper limb: Secondary | ICD-10-CM | POA: Diagnosis not present

## 2014-03-14 DIAGNOSIS — G5602 Carpal tunnel syndrome, left upper limb: Secondary | ICD-10-CM | POA: Diagnosis not present

## 2014-03-14 DIAGNOSIS — M65341 Trigger finger, right ring finger: Secondary | ICD-10-CM | POA: Diagnosis not present

## 2014-03-17 DIAGNOSIS — Z6832 Body mass index (BMI) 32.0-32.9, adult: Secondary | ICD-10-CM | POA: Diagnosis not present

## 2014-03-17 DIAGNOSIS — L03116 Cellulitis of left lower limb: Secondary | ICD-10-CM | POA: Diagnosis not present

## 2014-03-26 DIAGNOSIS — M65841 Other synovitis and tenosynovitis, right hand: Secondary | ICD-10-CM | POA: Diagnosis not present

## 2014-03-26 DIAGNOSIS — G5601 Carpal tunnel syndrome, right upper limb: Secondary | ICD-10-CM | POA: Diagnosis not present

## 2014-03-26 DIAGNOSIS — M65341 Trigger finger, right ring finger: Secondary | ICD-10-CM | POA: Diagnosis not present

## 2014-05-27 DIAGNOSIS — H5213 Myopia, bilateral: Secondary | ICD-10-CM | POA: Diagnosis not present

## 2014-05-27 DIAGNOSIS — H2513 Age-related nuclear cataract, bilateral: Secondary | ICD-10-CM | POA: Diagnosis not present

## 2015-02-04 DIAGNOSIS — E538 Deficiency of other specified B group vitamins: Secondary | ICD-10-CM | POA: Diagnosis not present

## 2015-02-04 DIAGNOSIS — R7301 Impaired fasting glucose: Secondary | ICD-10-CM | POA: Diagnosis not present

## 2015-02-04 DIAGNOSIS — Z125 Encounter for screening for malignant neoplasm of prostate: Secondary | ICD-10-CM | POA: Diagnosis not present

## 2015-02-04 DIAGNOSIS — E785 Hyperlipidemia, unspecified: Secondary | ICD-10-CM | POA: Diagnosis not present

## 2015-02-04 DIAGNOSIS — I10 Essential (primary) hypertension: Secondary | ICD-10-CM | POA: Diagnosis not present

## 2015-02-09 DIAGNOSIS — Z0000 Encounter for general adult medical examination without abnormal findings: Secondary | ICD-10-CM | POA: Diagnosis not present

## 2015-02-09 DIAGNOSIS — Z23 Encounter for immunization: Secondary | ICD-10-CM | POA: Diagnosis not present

## 2015-02-09 DIAGNOSIS — R42 Dizziness and giddiness: Secondary | ICD-10-CM | POA: Diagnosis not present

## 2015-02-09 DIAGNOSIS — M79641 Pain in right hand: Secondary | ICD-10-CM | POA: Diagnosis not present

## 2015-02-09 DIAGNOSIS — E785 Hyperlipidemia, unspecified: Secondary | ICD-10-CM | POA: Diagnosis not present

## 2015-02-09 DIAGNOSIS — Z9861 Coronary angioplasty status: Secondary | ICD-10-CM | POA: Diagnosis not present

## 2015-02-09 DIAGNOSIS — Z1389 Encounter for screening for other disorder: Secondary | ICD-10-CM | POA: Diagnosis not present

## 2015-02-09 DIAGNOSIS — E538 Deficiency of other specified B group vitamins: Secondary | ICD-10-CM | POA: Diagnosis not present

## 2015-02-09 DIAGNOSIS — Z6832 Body mass index (BMI) 32.0-32.9, adult: Secondary | ICD-10-CM | POA: Diagnosis not present

## 2015-02-09 DIAGNOSIS — I10 Essential (primary) hypertension: Secondary | ICD-10-CM | POA: Diagnosis not present

## 2015-02-09 DIAGNOSIS — R7301 Impaired fasting glucose: Secondary | ICD-10-CM | POA: Diagnosis not present

## 2015-02-09 DIAGNOSIS — M65311 Trigger thumb, right thumb: Secondary | ICD-10-CM | POA: Diagnosis not present

## 2015-02-10 ENCOUNTER — Telehealth: Payer: Self-pay | Admitting: Cardiology

## 2015-02-10 NOTE — Telephone Encounter (Signed)
Received records from [facility] for appointment on 06/05/15 with Dr Martinique.  Records given to [facility] (medical records) for Dr Doug Sou schedule on 06/15/15. lp

## 2015-02-17 DIAGNOSIS — Z1212 Encounter for screening for malignant neoplasm of rectum: Secondary | ICD-10-CM | POA: Diagnosis not present

## 2015-02-17 LAB — IFOBT (OCCULT BLOOD): IMMUNOLOGICAL FECAL OCCULT BLOOD TEST: NEGATIVE

## 2015-02-19 DIAGNOSIS — M65311 Trigger thumb, right thumb: Secondary | ICD-10-CM | POA: Diagnosis not present

## 2015-03-16 DIAGNOSIS — M65311 Trigger thumb, right thumb: Secondary | ICD-10-CM | POA: Diagnosis not present

## 2015-06-05 ENCOUNTER — Ambulatory Visit (INDEPENDENT_AMBULATORY_CARE_PROVIDER_SITE_OTHER): Payer: Medicare Other | Admitting: Cardiology

## 2015-06-05 ENCOUNTER — Encounter: Payer: Self-pay | Admitting: Cardiology

## 2015-06-05 VITALS — BP 150/76 | HR 93 | Ht 71.0 in | Wt 235.0 lb

## 2015-06-05 DIAGNOSIS — I25709 Atherosclerosis of coronary artery bypass graft(s), unspecified, with unspecified angina pectoris: Secondary | ICD-10-CM

## 2015-06-05 DIAGNOSIS — I10 Essential (primary) hypertension: Secondary | ICD-10-CM | POA: Diagnosis not present

## 2015-06-05 DIAGNOSIS — E785 Hyperlipidemia, unspecified: Secondary | ICD-10-CM

## 2015-06-05 DIAGNOSIS — I25810 Atherosclerosis of coronary artery bypass graft(s) without angina pectoris: Secondary | ICD-10-CM | POA: Insufficient documentation

## 2015-06-05 NOTE — Progress Notes (Signed)
Cardiology Office Note   Date:  06/05/2015   ID:  Kyle Baldwin, Kyle Baldwin [birth year], MRN [id number]  PCP:  Kyle Lopes, MD  Cardiologist:   Kyle Morales Martinique, MD   Chief Complaint  Patient presents with  . Chest Pain      History of Present Illness: Kyle Baldwin is a 70 y.o. male who presents for cardiac evaluation at the request of Dr. Philip Baldwin. He is 70 yo and has a history of CAD s/p CABG in 2007 by Dr. Servando Baldwin. This included a sequential SVG to the PDA and PLOM and LIMA to the LAD. He has done well since then and hasn't had cardiac follow up in 9 years. He also has a history of HTN and Hyperlipidemia. He reports these have been well controlled.  This past summer he complained of feeling lightheaded. This was positional and noted particularly when turning his head. He also felt soreness in his throat and neck. Was seen by ENT and had negative evaluation. Over the past month he has noted symptoms of anterior chest pressure. This is random and not associated with exertion. Thinks it is actually better with activity. No associated SOB or palpitations. Pressure may last as long as 20-30 minutes. Sometimes feels like he needs to belch. He is sedentary and doesn't exercise. Diet is poor ( a lot of bisqcuits and gravy)    Past Medical History  Diagnosis Date  . Myocardial infarction [facility]) 2007    heart bypass 2007  . Hypertension   . CAD (coronary artery disease)   . Hyperlipidemia   . GERD (gastroesophageal reflux disease)   . Bladder cancer ([place]) 2005    bladder cancer in situ 2005  . Bilateral carpal tunnel syndrome   . Vitamin B 12 deficiency   . Diverticulosis   . Kidney stones   . Fatty liver   . Liver cyst right    hepatic lobe cyst    Past Surgical History  Procedure Laterality Date  . Heart bypass  2007    3 vessels  . Lumbar laminectomy    . Bladder surgery  2005     Current Outpatient Prescriptions  Medication Sig Dispense Refill  . amLODipine (NORVASC)  5 MG tablet Take 5 mg by mouth daily.    [name] aspirin 325 MG tablet Take 325 mg by mouth daily.    . cyanocobalamin 1000 MCG tablet Take 1,000 mcg by mouth daily.     [name] losartan (COZAAR) 100 MG tablet Take 100 mg by mouth daily.     . metoprolol tartrate (LOPRESSOR) 25 MG tablet Take 25 mg by mouth daily.     . nitroGLYCERIN (NITROSTAT) 0.4 MG SL tablet Place 0.4 mg under the tongue every 5 (five) minutes as needed for chest pain.    [name] omeprazole (PRILOSEC) 40 MG capsule Take 40 mg by mouth 2 (two) times daily.    . simvastatin (ZOCOR) 20 MG tablet Take 20 mg by mouth daily.      No current facility-administered medications for this visit.    Allergies:   Codeine and Morphine and related    Social History:  The patient  reports that he quit smoking about 47 years ago. His smoking use included Cigarettes. He has never used smokeless tobacco. He reports that he does not drink alcohol or use illicit drugs.   Family History:  The patient's family history includes Colon cancer (age of onset: 10) in his maternal grandfather; Diabetes in his maternal grandmother;  Heart disease in his maternal grandmother. There is no history of Stomach cancer.    ROS:  Please see the history of present illness.   Otherwise, review of systems are positive for none.   All other systems are reviewed and negative.    PHYSICAL EXAM: VS:  BP 150/76 mmHg  Pulse 93  Ht 5\' 11"  (1.803 m)  Wt 106.595 kg (235 lb)  BMI 32.79 kg/m2 , BMI Body mass index is 32.79 kg/(m^2). GEN: Well nourished, well developed, in no acute distress HEENT: normal Neck: no JVD, carotid bruits, or masses Cardiac: RRR; no murmurs, rubs, or gallops,no edema  Respiratory:  clear to auscultation bilaterally, normal work of breathing GI: soft, nontender, nondistended, + BS, large ventral/umbilical hernia. MS: no deformity or atrophy Pulses 2+ throughout Skin: warm and dry, no rash Neuro:  Strength and sensation are intact Psych: euthymic mood,  full affect   EKG:  EKG is ordered today. The ekg ordered today demonstrates NSR with rate 79. Possible old inferior MI with small q waves in 3 and Avr. I have personally reviewed and interpreted this study.    Recent Labs: No results found for requested labs within last 365 days.    Lipid Panel No results found for: CHOL, TRIG, HDL, CHOLHDL, VLDL, LDLCALC, LDLDIRECT    Wt Readings from Last 3 Encounters:  06/05/15 106.595 kg (235 lb)  12/19/12 101.266 kg (223 lb 4 oz)  02/29/12 106.595 kg (235 lb)      Other studies Reviewed: Additional studies/ records that were reviewed today include: none. Review of the above records demonstrates: N/A   ASSESSMENT AND PLAN:  1.  Chest pain with atypical features. He is 9 years s/p CABG so do need to consider angina. Will arrange for a stress Myoview. If abnormal he will need cardiac cath. If normal will stress risk factor modification and lifestyle changes.  2. CAD s/p CABG in 2007.   3. HTN  4. Hyperlipidemia.    Current medicines are reviewed at length with the patient today.  The patient does not have concerns regarding medicines.  The following changes have been made:  no change  Labs/ tests ordered today include:  Orders Placed This Encounter  Procedures  . Myocardial Perfusion Imaging  . EKG 12-Lead     Disposition:   FU with me depending on results of stress test.  Signed, Crawford Tamura Martinique, MD  06/05/2015 10:47 AM    [facility] [address] [place], [place], [address] Phone [phone], Fax [phone]

## 2015-06-05 NOTE — Patient Instructions (Signed)
We will schedule you for a nuclear stress test  Continue your current therapy  I will request a copy of your lab work with [name]

## 2015-06-18 ENCOUNTER — Telehealth (HOSPITAL_COMMUNITY): Payer: Self-pay

## 2015-06-18 NOTE — Telephone Encounter (Signed)
Encounter complete. 

## 2015-06-23 ENCOUNTER — Ambulatory Visit (HOSPITAL_COMMUNITY)
Admission: RE | Admit: 2015-06-23 | Discharge: 2015-06-23 | Disposition: A | Discharge status: Home / Self Care | Payer: Medicare Other | Source: Ambulatory Visit | Attending: Cardiology | Admitting: Cardiology

## 2015-06-23 ENCOUNTER — Telehealth (HOSPITAL_COMMUNITY): Payer: Self-pay

## 2015-06-23 DIAGNOSIS — E669 Obesity, unspecified: Secondary | ICD-10-CM | POA: Insufficient documentation

## 2015-06-23 DIAGNOSIS — I25709 Atherosclerosis of coronary artery bypass graft(s), unspecified, with unspecified angina pectoris: Secondary | ICD-10-CM

## 2015-06-23 DIAGNOSIS — R9439 Abnormal result of other cardiovascular function study: Secondary | ICD-10-CM | POA: Insufficient documentation

## 2015-06-23 DIAGNOSIS — E785 Hyperlipidemia, unspecified: Secondary | ICD-10-CM | POA: Diagnosis not present

## 2015-06-23 DIAGNOSIS — R5383 Other fatigue: Secondary | ICD-10-CM | POA: Diagnosis not present

## 2015-06-23 DIAGNOSIS — R42 Dizziness and giddiness: Secondary | ICD-10-CM | POA: Diagnosis not present

## 2015-06-23 DIAGNOSIS — Z87891 Personal history of nicotine dependence: Secondary | ICD-10-CM | POA: Diagnosis not present

## 2015-06-23 DIAGNOSIS — Z6832 Body mass index (BMI) 32.0-32.9, adult: Secondary | ICD-10-CM | POA: Insufficient documentation

## 2015-06-23 DIAGNOSIS — I10 Essential (primary) hypertension: Secondary | ICD-10-CM

## 2015-06-23 DIAGNOSIS — R002 Palpitations: Secondary | ICD-10-CM | POA: Diagnosis not present

## 2015-06-23 DIAGNOSIS — Z8249 Family history of ischemic heart disease and other diseases of the circulatory system: Secondary | ICD-10-CM | POA: Diagnosis not present

## 2015-06-23 DIAGNOSIS — R079 Chest pain, unspecified: Secondary | ICD-10-CM | POA: Diagnosis not present

## 2015-06-23 LAB — MYOCARDIAL PERFUSION IMAGING
CHL CUP MPHR: 153 {beats}/min
CHL CUP NUCLEAR SRS: 0
CHL CUP RESTING HR STRESS: 88 {beats}/min
CHL RATE OF PERCEIVED EXERTION: 16
CSEPEW: 8.5 METS
CSEPPHR: 155 {beats}/min
Exercise duration (min): 7 min
LVDIAVOL: 99 mL
LVSYSVOL: 49 mL
NUC STRESS TID: 1.02
Percent HR: 101 %
SDS: 4
SSS: 4

## 2015-06-23 MED ORDER — TECHNETIUM TC 99M SESTAMIBI GENERIC - CARDIOLITE: 31.9000 | Freq: Once | INTRAVENOUS | Status: DC | PRN

## 2015-06-23 MED ORDER — TECHNETIUM TC 99M SESTAMIBI GENERIC - CARDIOLITE: 10.7000 | Freq: Once | INTRAVENOUS | Status: DC | PRN

## 2015-06-25 NOTE — Telephone Encounter (Signed)
Encounter complete. 

## 2016-02-05 DIAGNOSIS — E784 Other hyperlipidemia: Secondary | ICD-10-CM | POA: Diagnosis not present

## 2016-02-05 DIAGNOSIS — E538 Deficiency of other specified B group vitamins: Secondary | ICD-10-CM | POA: Diagnosis not present

## 2016-02-05 DIAGNOSIS — I10 Essential (primary) hypertension: Secondary | ICD-10-CM | POA: Diagnosis not present

## 2016-02-05 DIAGNOSIS — Z125 Encounter for screening for malignant neoplasm of prostate: Secondary | ICD-10-CM | POA: Diagnosis not present

## 2016-02-05 DIAGNOSIS — R7301 Impaired fasting glucose: Secondary | ICD-10-CM | POA: Diagnosis not present

## 2016-02-10 DIAGNOSIS — Z9861 Coronary angioplasty status: Secondary | ICD-10-CM | POA: Diagnosis not present

## 2016-02-10 DIAGNOSIS — R490 Dysphonia: Secondary | ICD-10-CM | POA: Diagnosis not present

## 2016-02-10 DIAGNOSIS — Z6832 Body mass index (BMI) 32.0-32.9, adult: Secondary | ICD-10-CM | POA: Diagnosis not present

## 2016-02-10 DIAGNOSIS — L988 Other specified disorders of the skin and subcutaneous tissue: Secondary | ICD-10-CM | POA: Diagnosis not present

## 2016-02-10 DIAGNOSIS — Z1389 Encounter for screening for other disorder: Secondary | ICD-10-CM | POA: Diagnosis not present

## 2016-02-10 DIAGNOSIS — E785 Hyperlipidemia, unspecified: Secondary | ICD-10-CM | POA: Diagnosis not present

## 2016-02-10 DIAGNOSIS — Z23 Encounter for immunization: Secondary | ICD-10-CM | POA: Diagnosis not present

## 2016-02-10 DIAGNOSIS — R7301 Impaired fasting glucose: Secondary | ICD-10-CM | POA: Diagnosis not present

## 2016-02-10 DIAGNOSIS — M545 Low back pain: Secondary | ICD-10-CM | POA: Diagnosis not present

## 2016-02-10 DIAGNOSIS — I10 Essential (primary) hypertension: Secondary | ICD-10-CM | POA: Diagnosis not present

## 2016-02-10 DIAGNOSIS — K219 Gastro-esophageal reflux disease without esophagitis: Secondary | ICD-10-CM | POA: Diagnosis not present

## 2016-02-10 DIAGNOSIS — Z0000 Encounter for general adult medical examination without abnormal findings: Secondary | ICD-10-CM | POA: Diagnosis not present

## 2016-02-11 DIAGNOSIS — Z1212 Encounter for screening for malignant neoplasm of rectum: Secondary | ICD-10-CM | POA: Diagnosis not present

## 2016-02-26 DIAGNOSIS — D485 Neoplasm of uncertain behavior of skin: Secondary | ICD-10-CM | POA: Diagnosis not present

## 2016-02-26 DIAGNOSIS — L821 Other seborrheic keratosis: Secondary | ICD-10-CM | POA: Diagnosis not present

## 2016-02-26 DIAGNOSIS — L738 Other specified follicular disorders: Secondary | ICD-10-CM | POA: Diagnosis not present

## 2016-02-26 DIAGNOSIS — D0462 Carcinoma in situ of skin of left upper limb, including shoulder: Secondary | ICD-10-CM | POA: Diagnosis not present

## 2016-06-26 NOTE — Progress Notes (Signed)
Cardiology Office Note   Date:  06/27/2016   ID:  Charli, Curless 1946-01-16, MRN [id number]  PCP:  [name], MD  Cardiologist:   Peter Martinique, MD   Chief Complaint  Patient presents with  . Coronary Artery Disease      History of Present Illness: Sotirios Radden Stephan is a 71 y.o. male who is seen for follow up  of CAD s/p CABG in 2007 by Dr. Servando Snare. This included a sequential SVG to the PDA and PLOM and LIMA to the LAD.  He also has a history of HTN and Hyperlipidemia.   He is seen for yearly follow up. He denies any SOB, chest pain, palpitations, or edema. He does have chronic burning in his throat. He has seen ENT and GI in the past. He has a poor diet and eats out a lot. His wife had a CVA about 4 years ago and he is the primary care giver. He doesn't exercise much. He has gained 10 lbs since last year.     Past Medical History:  Diagnosis Date  . Bilateral carpal tunnel syndrome   . Bladder cancer ([place]) 2005   bladder cancer in situ 2005  . CAD (coronary artery disease)   . Diverticulosis   . Fatty liver   . GERD (gastroesophageal reflux disease)   . Hyperlipidemia   . Hypertension   . Kidney stones   . Liver cyst right   hepatic lobe cyst  . Myocardial infarction 2007   heart bypass 2007  . Vitamin B 12 deficiency     Past Surgical History:  Procedure Laterality Date  . BLADDER SURGERY  2005  . heart bypass  2007   3 vessels  . LUMBAR LAMINECTOMY       Current Outpatient Prescriptions  Medication Sig Dispense Refill  . amLODipine (NORVASC) 5 MG tablet Take 5 mg by mouth daily.    [name] aspirin EC 81 MG tablet Take 81 mg by mouth daily.    . cyanocobalamin 1000 MCG tablet Take 1,000 mcg by mouth daily.     [name] losartan (COZAAR) 100 MG tablet Take 100 mg by mouth daily.     . metoCLOPramide (REGLAN) 5 MG tablet Take 1 tablet by mouth daily.  12  . metoprolol tartrate (LOPRESSOR) 25 MG tablet Take 25 mg by mouth daily.     . nitroGLYCERIN  (NITROSTAT) 0.4 MG SL tablet Place 0.4 mg under the tongue every 5 (five) minutes as needed for chest pain.    [name] omeprazole (PRILOSEC) 20 MG capsule Take 20 mg by mouth daily.    . simvastatin (ZOCOR) 20 MG tablet Take 20 mg by mouth daily.      No current facility-administered medications for this visit.     Allergies:   Codeine and Morphine and related    Social History:  The patient  reports that he quit smoking about 48 years ago. His smoking use included Cigarettes. He has never used smokeless tobacco. He reports that he does not drink alcohol or use drugs.   Family History:  The patient's family history includes Colon cancer (age of onset: 4) in his maternal grandfather; Diabetes in his maternal grandmother; Heart disease in his maternal grandmother.    ROS:  Please see the history of present illness.   Otherwise, review of systems are positive for none.   All other systems are reviewed and negative.    PHYSICAL EXAM: VS:  BP 138/78   Pulse  82   Ht 6' (1.829 m)   Wt 245 lb (111.1 kg)   BMI 33.23 kg/m  , BMI Body mass index is 33.23 kg/m. GEN: Well nourished, obese, in no acute distress  HEENT: normal  Neck: no JVD, carotid bruits, or masses Cardiac: RRR; no murmurs, rubs, or gallops,no edema  Respiratory:  clear to auscultation bilaterally, normal work of breathing GI: soft, nontender, nondistended, + BS, large ventral/umbilical hernia. MS: no deformity or atrophy  Pulses 2+ throughout Skin: warm and dry, no rash Neuro:  Strength and sensation are intact Psych: euthymic mood, full affect   EKG:  EKG is ordered today. The ekg ordered today demonstrates NSR with rate 82. Possible old inferior MI with small q waves in 3 and Avr. I have personally reviewed and interpreted this study.    Recent Labs: No results found for requested labs within last 8760 hours.    Lipid Panel No results found for: CHOL, TRIG, HDL, CHOLHDL, VLDL, LDLCALC, LDLDIRECT    Wt Readings  from Last 3 Encounters:  06/27/16 245 lb (111.1 kg)  06/23/15 235 lb (106.6 kg)  06/05/15 235 lb (106.6 kg)     Labs dated 02/05/16: cholesterol 126, triglycerides 91, LDL 74, HDL 34. A1c 5.2%. CMET, TSH, CBC normal  Other studies Reviewed: Additional studies/ records that were reviewed today include: Myoview 06/23/15: Review of the above records demonstrates: Study Highlights    Nuclear stress EF: 51%.  The left ventricular ejection fraction is mildly decreased (45-54%).  There was no ST segment deviation noted during stress.  No T wave inversion was noted during stress.  Defect 1: There is a small defect of mild severity present in the basal inferior location.  This is a low risk study.   Low risk stress nuclear study with a small fixed basal inferior fixed defect (small scar versus diaphragmatic attenuation artifact). No reversible ischemia is seen. Borderline left ventricular global systolic function      ASSESSMENT AND PLAN:  1.  CAD s/p CABG 2007. He is 10 years s/p CABG. Myoview Jan 2017 was low risk. Will continue with medical therapy. Stressed the importance of lifestyle modification with mediterranean style diet, weight loss, and increased aerobic activity.   2. HTN  3. Hyperlipidemia.    Current medicines are reviewed at length with the patient today.  The patient does not have concerns regarding medicines.  The following changes have been made:  no change  Labs/ tests ordered today include:  No orders of the defined types were placed in this encounter.    Disposition:   FU with me one year  Signed, Peter Martinique, MD  06/27/2016 10:56 AM    [facility] [address], [place], [place], [address] Phone [phone], Fax [phone]

## 2016-06-27 ENCOUNTER — Encounter: Payer: Self-pay | Admitting: Cardiology

## 2016-06-27 ENCOUNTER — Ambulatory Visit (INDEPENDENT_AMBULATORY_CARE_PROVIDER_SITE_OTHER): Payer: Medicare Other | Admitting: Cardiology

## 2016-06-27 VITALS — BP 138/78 | HR 82 | Ht 72.0 in | Wt 245.0 lb

## 2016-06-27 DIAGNOSIS — E7800 Pure hypercholesterolemia, unspecified: Secondary | ICD-10-CM | POA: Diagnosis not present

## 2016-06-27 DIAGNOSIS — I10 Essential (primary) hypertension: Secondary | ICD-10-CM

## 2016-06-27 DIAGNOSIS — I25810 Atherosclerosis of coronary artery bypass graft(s) without angina pectoris: Secondary | ICD-10-CM

## 2016-06-27 NOTE — Patient Instructions (Signed)
Continue your current therapy  Focus on a heart healthy diet, weight loss, and increased aerobic activity  I will see you in one year.

## 2016-06-27 NOTE — Addendum Note (Signed)
Addended by: Kathyrn Lass on: 06/27/2016 12:13 PM   Modules accepted: Orders

## 2016-07-29 DIAGNOSIS — Z85828 Personal history of other malignant neoplasm of skin: Secondary | ICD-10-CM | POA: Diagnosis not present

## 2016-07-29 DIAGNOSIS — L821 Other seborrheic keratosis: Secondary | ICD-10-CM | POA: Diagnosis not present

## 2016-07-29 DIAGNOSIS — L570 Actinic keratosis: Secondary | ICD-10-CM | POA: Diagnosis not present

## 2016-07-29 DIAGNOSIS — L820 Inflamed seborrheic keratosis: Secondary | ICD-10-CM | POA: Diagnosis not present

## 2017-02-09 DIAGNOSIS — Z125 Encounter for screening for malignant neoplasm of prostate: Secondary | ICD-10-CM | POA: Diagnosis not present

## 2017-02-09 DIAGNOSIS — I10 Essential (primary) hypertension: Secondary | ICD-10-CM | POA: Diagnosis not present

## 2017-02-09 DIAGNOSIS — E784 Other hyperlipidemia: Secondary | ICD-10-CM | POA: Diagnosis not present

## 2017-02-09 DIAGNOSIS — R7301 Impaired fasting glucose: Secondary | ICD-10-CM | POA: Diagnosis not present

## 2017-02-09 DIAGNOSIS — E538 Deficiency of other specified B group vitamins: Secondary | ICD-10-CM | POA: Diagnosis not present

## 2017-02-23 DIAGNOSIS — M545 Low back pain: Secondary | ICD-10-CM | POA: Diagnosis not present

## 2017-02-23 DIAGNOSIS — E538 Deficiency of other specified B group vitamins: Secondary | ICD-10-CM | POA: Diagnosis not present

## 2017-02-23 DIAGNOSIS — K219 Gastro-esophageal reflux disease without esophagitis: Secondary | ICD-10-CM | POA: Diagnosis not present

## 2017-02-23 DIAGNOSIS — Z9861 Coronary angioplasty status: Secondary | ICD-10-CM | POA: Diagnosis not present

## 2017-02-23 DIAGNOSIS — R7301 Impaired fasting glucose: Secondary | ICD-10-CM | POA: Diagnosis not present

## 2017-02-23 DIAGNOSIS — Z23 Encounter for immunization: Secondary | ICD-10-CM | POA: Diagnosis not present

## 2017-02-23 DIAGNOSIS — C679 Malignant neoplasm of bladder, unspecified: Secondary | ICD-10-CM | POA: Diagnosis not present

## 2017-02-23 DIAGNOSIS — E784 Other hyperlipidemia: Secondary | ICD-10-CM | POA: Diagnosis not present

## 2017-02-23 DIAGNOSIS — Z1389 Encounter for screening for other disorder: Secondary | ICD-10-CM | POA: Diagnosis not present

## 2017-02-23 DIAGNOSIS — I10 Essential (primary) hypertension: Secondary | ICD-10-CM | POA: Diagnosis not present

## 2017-02-23 DIAGNOSIS — Z0000 Encounter for general adult medical examination without abnormal findings: Secondary | ICD-10-CM | POA: Diagnosis not present

## 2017-02-23 DIAGNOSIS — Z6833 Body mass index (BMI) 33.0-33.9, adult: Secondary | ICD-10-CM | POA: Diagnosis not present

## 2017-03-01 DIAGNOSIS — Z1212 Encounter for screening for malignant neoplasm of rectum: Secondary | ICD-10-CM | POA: Diagnosis not present

## 2017-05-11 DIAGNOSIS — L821 Other seborrheic keratosis: Secondary | ICD-10-CM | POA: Diagnosis not present

## 2017-05-11 DIAGNOSIS — L570 Actinic keratosis: Secondary | ICD-10-CM | POA: Diagnosis not present

## 2017-06-13 ENCOUNTER — Encounter: Payer: Self-pay | Admitting: Cardiology

## 2017-06-22 NOTE — Progress Notes (Signed)
Cardiology Office Note   Date:  06/29/2017   ID:  Yedidya, Duddy [birth year], MRN [id number]  PCP:  [name], MD  Cardiologist:   Peter Martinique, MD   Chief Complaint  Patient presents with  . Coronary Artery Disease      History of Present Illness: Kyle Baldwin is a 72 y.o. male who is seen for follow up  of CAD s/p CABG in 2007 by Dr. Servando Snare. This included a sequential SVG to the PDA and PLOM and LIMA to the LAD.  He also has a history of HTN and Hyperlipidemia.   He is seen for yearly follow up. He denies any chest pain, dyspnea, palpitations, edema. His diet has improved some. He eats oatmeal in the am now with fruit instead of biscuits and gravy. Still eats out a lot. Helps keep 5 grandchildren.    Past Medical History:  Diagnosis Date  . Bilateral carpal tunnel syndrome   . Bladder cancer ([place]) 2005   bladder cancer in situ 2005  . CAD (coronary artery disease)   . Diverticulosis   . Fatty liver   . GERD (gastroesophageal reflux disease)   . Hyperlipidemia   . Hypertension   . Kidney stones   . Liver cyst right   hepatic lobe cyst  . Myocardial infarction [facility]) 2007   heart bypass 2007  . Vitamin B 12 deficiency     Past Surgical History:  Procedure Laterality Date  . BLADDER SURGERY  2005  . heart bypass  2007   3 vessels  . LUMBAR LAMINECTOMY       Current Outpatient Medications  Medication Sig Dispense Refill  . amLODipine (NORVASC) 5 MG tablet Take 5 mg by mouth daily.    [name] aspirin EC 81 MG tablet Take 81 mg by mouth daily.    . cyanocobalamin 1000 MCG tablet Take 1,000 mcg by mouth daily.     [name] losartan (COZAAR) 100 MG tablet Take 100 mg by mouth daily.     . metoprolol tartrate (LOPRESSOR) 25 MG tablet Take 25 mg by mouth daily.     . nitroGLYCERIN (NITROSTAT) 0.4 MG SL tablet Place 0.4 mg under the tongue every 5 (five) minutes as needed for chest pain.    [name] omeprazole (PRILOSEC) 20 MG capsule Take 20 mg by mouth daily.    .  simvastatin (ZOCOR) 20 MG tablet Take 20 mg by mouth daily.      No current facility-administered medications for this visit.     Allergies:   Codeine and Morphine and related    Social History:  The patient  reports that he quit smoking about 49 years ago. His smoking use included cigarettes. he has never used smokeless tobacco. He reports that he does not drink alcohol or use drugs.   Family History:  The patient's family history includes Colon cancer (age of onset: 67) in his maternal grandfather; Diabetes in his maternal grandmother; Heart disease in his maternal grandmother.    ROS:  Please see the history of present illness.   Otherwise, review of systems are positive for none.   All other systems are reviewed and negative.    PHYSICAL EXAM: VS:  BP 122/80   Pulse 61   Ht 5\' 11"  (1.803 m)   Wt 243 lb (110.2 kg)   BMI 33.89 kg/m  , BMI Body mass index is 33.89 kg/m. GENERAL:  Well appearing WM in NAD HEENT:  PERRL, EOMI, sclera are clear.  Oropharynx is clear. NECK:  No jugular venous distention, carotid upstroke brisk and symmetric, no bruits, no thyromegaly or adenopathy LUNGS:  Clear to auscultation bilaterally CHEST:  Unremarkable HEART:  RRR,  PMI not displaced or sustained,S1 and S2 within normal limits, no S3, no S4: no clicks, no rubs, no murmurs ABD:  Soft, nontender. BS +, no masses or bruits. No hepatomegaly, no splenomegaly EXT:  2 + pulses throughout, no edema, no cyanosis no clubbing SKIN:  Warm and dry.  No rashes NEURO:  Alert and oriented x 3. Cranial nerves II through XII intact. PSYCH:  Cognitively intact      EKG:  EKG is ordered today. The ekg ordered today demonstrates NSR with rate 61. Normal Ecg. I have personally reviewed and interpreted this study.    Recent Labs: No results found for requested labs within last 8760 hours.    Lipid Panel No results found for: CHOL, TRIG, HDL, CHOLHDL, VLDL, LDLCALC, LDLDIRECT    Wt Readings from Last  3 Encounters:  06/29/17 243 lb (110.2 kg)  06/27/16 245 lb (111.1 kg)  06/23/15 235 lb (106.6 kg)     Labs dated 02/05/16: cholesterol 126, triglycerides 91, LDL 74, HDL 34. A1c 5.2%. CMET, TSH, CBC normal Dated 02/09/17: cholesterol 130, triglycerides 111, HDL 33, LDL 75. A1c 5.2%. Creatinine 1.2. CBC and ALT normal.  Other studies Reviewed: Additional studies/ records that were reviewed today include: Myoview 06/23/15: Review of the above records demonstrates: Study Highlights    Nuclear stress EF: 51%.  The left ventricular ejection fraction is mildly decreased (45-54%).  There was no ST segment deviation noted during stress.  No T wave inversion was noted during stress.  Defect 1: There is a small defect of mild severity present in the basal inferior location.  This is a low risk study.   Low risk stress nuclear study with a small fixed basal inferior fixed defect (small scar versus diaphragmatic attenuation artifact). No reversible ischemia is seen. Borderline left ventricular global systolic function      ASSESSMENT AND PLAN:  1.  CAD s/p CABG 2007. He is 10 years s/p CABG. Myoview Jan 2017 was low risk. He is asymptomatic. Will continue with medical therapy. Work on  lifestyle modification especially with dietary modification and more aerobic activity.  2. HTN  3. Hyperlipidemia.    Current medicines are reviewed at length with the patient today.  The patient does not have concerns regarding medicines.  The following changes have been made:  no change  Labs/ tests ordered today include:  No orders of the defined types were placed in this encounter.    Disposition:   FU with me one year  Signed, Peter Martinique, MD  06/29/2017 2:34 PM    [facility] [address], [place], [place], [address] Phone [phone], Fax [phone]

## 2017-06-29 ENCOUNTER — Ambulatory Visit (INDEPENDENT_AMBULATORY_CARE_PROVIDER_SITE_OTHER): Payer: Medicare Other | Admitting: Cardiology

## 2017-06-29 ENCOUNTER — Encounter: Payer: Self-pay | Admitting: Cardiology

## 2017-06-29 VITALS — BP 122/80 | HR 61 | Ht 71.0 in | Wt 243.0 lb

## 2017-06-29 DIAGNOSIS — I25810 Atherosclerosis of coronary artery bypass graft(s) without angina pectoris: Secondary | ICD-10-CM | POA: Diagnosis not present

## 2017-06-29 DIAGNOSIS — I10 Essential (primary) hypertension: Secondary | ICD-10-CM | POA: Diagnosis not present

## 2017-06-29 DIAGNOSIS — E7800 Pure hypercholesterolemia, unspecified: Secondary | ICD-10-CM | POA: Diagnosis not present

## 2017-06-29 NOTE — Patient Instructions (Signed)
Continue your current therapy  I will see you in one year   

## 2018-03-09 DIAGNOSIS — E7849 Other hyperlipidemia: Secondary | ICD-10-CM | POA: Diagnosis not present

## 2018-03-09 DIAGNOSIS — R7301 Impaired fasting glucose: Secondary | ICD-10-CM | POA: Diagnosis not present

## 2018-03-09 DIAGNOSIS — I10 Essential (primary) hypertension: Secondary | ICD-10-CM | POA: Diagnosis not present

## 2018-03-09 DIAGNOSIS — R82998 Other abnormal findings in urine: Secondary | ICD-10-CM | POA: Diagnosis not present

## 2018-03-09 DIAGNOSIS — E538 Deficiency of other specified B group vitamins: Secondary | ICD-10-CM | POA: Diagnosis not present

## 2018-03-09 DIAGNOSIS — Z125 Encounter for screening for malignant neoplasm of prostate: Secondary | ICD-10-CM | POA: Diagnosis not present

## 2018-03-15 DIAGNOSIS — Z23 Encounter for immunization: Secondary | ICD-10-CM | POA: Diagnosis not present

## 2018-03-16 DIAGNOSIS — I10 Essential (primary) hypertension: Secondary | ICD-10-CM | POA: Diagnosis not present

## 2018-03-16 DIAGNOSIS — Z6832 Body mass index (BMI) 32.0-32.9, adult: Secondary | ICD-10-CM | POA: Diagnosis not present

## 2018-03-16 DIAGNOSIS — Z1389 Encounter for screening for other disorder: Secondary | ICD-10-CM | POA: Diagnosis not present

## 2018-03-16 DIAGNOSIS — E668 Other obesity: Secondary | ICD-10-CM | POA: Diagnosis not present

## 2018-03-16 DIAGNOSIS — M545 Low back pain: Secondary | ICD-10-CM | POA: Diagnosis not present

## 2018-03-16 DIAGNOSIS — Z0000 Encounter for general adult medical examination without abnormal findings: Secondary | ICD-10-CM | POA: Diagnosis not present

## 2018-03-16 DIAGNOSIS — E538 Deficiency of other specified B group vitamins: Secondary | ICD-10-CM | POA: Diagnosis not present

## 2018-03-16 DIAGNOSIS — E7849 Other hyperlipidemia: Secondary | ICD-10-CM | POA: Diagnosis not present

## 2018-03-16 DIAGNOSIS — K219 Gastro-esophageal reflux disease without esophagitis: Secondary | ICD-10-CM | POA: Diagnosis not present

## 2018-03-16 DIAGNOSIS — R7301 Impaired fasting glucose: Secondary | ICD-10-CM | POA: Diagnosis not present

## 2018-03-16 DIAGNOSIS — R05 Cough: Secondary | ICD-10-CM | POA: Diagnosis not present

## 2018-03-16 DIAGNOSIS — Z9861 Coronary angioplasty status: Secondary | ICD-10-CM | POA: Diagnosis not present

## 2018-03-19 DIAGNOSIS — Z1212 Encounter for screening for malignant neoplasm of rectum: Secondary | ICD-10-CM | POA: Diagnosis not present

## 2018-05-29 ENCOUNTER — Other Ambulatory Visit: Payer: Self-pay

## 2018-05-29 ENCOUNTER — Emergency Department (HOSPITAL_COMMUNITY): Payer: Medicare Other

## 2018-05-29 ENCOUNTER — Emergency Department (HOSPITAL_COMMUNITY)
Admission: EM | Admit: 2018-05-29 | Discharge: 2018-05-29 | Disposition: A | Discharge status: Home / Self Care | Payer: Medicare Other | Attending: Emergency Medicine | Admitting: Emergency Medicine

## 2018-05-29 ENCOUNTER — Encounter (HOSPITAL_COMMUNITY): Payer: Self-pay | Admitting: *Deleted

## 2018-05-29 DIAGNOSIS — R0602 Shortness of breath: Secondary | ICD-10-CM

## 2018-05-29 DIAGNOSIS — Z8551 Personal history of malignant neoplasm of bladder: Secondary | ICD-10-CM | POA: Insufficient documentation

## 2018-05-29 DIAGNOSIS — I2510 Atherosclerotic heart disease of native coronary artery without angina pectoris: Secondary | ICD-10-CM | POA: Diagnosis not present

## 2018-05-29 DIAGNOSIS — I10 Essential (primary) hypertension: Secondary | ICD-10-CM | POA: Insufficient documentation

## 2018-05-29 DIAGNOSIS — Z7982 Long term (current) use of aspirin: Secondary | ICD-10-CM | POA: Insufficient documentation

## 2018-05-29 DIAGNOSIS — Z87891 Personal history of nicotine dependence: Secondary | ICD-10-CM | POA: Insufficient documentation

## 2018-05-29 DIAGNOSIS — E785 Hyperlipidemia, unspecified: Secondary | ICD-10-CM | POA: Diagnosis not present

## 2018-05-29 DIAGNOSIS — Z79899 Other long term (current) drug therapy: Secondary | ICD-10-CM | POA: Insufficient documentation

## 2018-05-29 DIAGNOSIS — I252 Old myocardial infarction: Secondary | ICD-10-CM | POA: Insufficient documentation

## 2018-05-29 LAB — BASIC METABOLIC PANEL
Anion gap: 9 (ref 5–15)
BUN: 18 mg/dL (ref 8–23)
CO2: 27 mmol/L (ref 22–32)
Calcium: 9 mg/dL (ref 8.9–10.3)
Chloride: 106 mmol/L (ref 98–111)
Creatinine, Ser: 1.28 mg/dL — ABNORMAL HIGH (ref 0.61–1.24)
GFR calc Af Amer: >60 mL/min (ref >60)
GFR calc non Af Amer: 56 mL/min — ABNORMAL LOW (ref >60)
Glucose, Bld: 111 mg/dL — ABNORMAL HIGH (ref 70–99)
Potassium: 3.9 mmol/L (ref 3.5–5.1)
Sodium: 142 mmol/L (ref 135–145)

## 2018-05-29 LAB — CBC
HCT: 40.8 % (ref 39.0–52.0)
Hemoglobin: 13.4 g/dL (ref 13.0–17.0)
MCH: 29.6 pg (ref 26.0–34.0)
MCHC: 32.8 g/dL (ref 30.0–36.0)
MCV: 90.3 fL (ref 80.0–100.0)
Platelets: 162 10*3/uL (ref 150–400)
RBC: 4.52 MIL/uL (ref 4.22–5.81)
RDW: 13.3 % (ref 11.5–15.5)
WBC: 5.4 10*3/uL (ref 4.0–10.5)
nRBC: 0 % (ref 0.0–0.2)

## 2018-05-29 LAB — I-STAT TROPONIN, ED: Troponin i, poc: 0.01 ng/mL (ref 0.00–0.08)

## 2018-05-29 NOTE — ED Triage Notes (Signed)
To ED for eval after waking up feeling like his throat was closed. Pt states this lasted for just a min and then he felt better. Pt and wife deny pt having sleep apnea or snoring at night. No cp. No sob now. States he feels like he's breathing fine now and without complaint

## 2018-05-29 NOTE — ED Provider Notes (Signed)
TIME SEEN: 5:54 AM  CHIEF COMPLAINT: Shortness of breath  HPI: Patient is a 72 year old male with history of CAD status post bypass, hypertension, hyperlipidemia, previous history of bladder cancer in 2005 who presents to the emergency department with a brief episode of shortness of breath that woke him from sleep at 3:45 AM.  States he was sleeping and all of a sudden felt short of breath.  States he sat upright in bed and felt like for 5 to 10 seconds he could not catch his breath or take a deep breath.  States symptoms then completely resolved and he has been asymptomatic since.  No chest pain or chest discomfort.  No fever.  Has had several days of dry cough and he feels like he is getting a "cold".  No lower extremity swelling or pain.  No history of PE or DVT.  No history of CHF.  No history of obstructive sleep apnea.  No lip or tongue swelling.  No feeling of swelling in his throat or tightness in his throat or neck.  ROS: See HPI Constitutional: no fever  Eyes: no drainage  ENT: no runny nose   Cardiovascular:  no chest pain  Resp:  SOB  GI: no vomiting GU: no dysuria Integumentary: no rash  Allergy: no hives  Musculoskeletal: no leg swelling  Neurological: no slurred speech ROS otherwise negative  PAST MEDICAL HISTORY/PAST SURGICAL HISTORY:  Past Medical History:  Diagnosis Date  . Bilateral carpal tunnel syndrome   . Bladder cancer ([place]) 2005   bladder cancer in situ 2005  . CAD (coronary artery disease)   . Diverticulosis   . Fatty liver   . GERD (gastroesophageal reflux disease)   . Hyperlipidemia   . Hypertension   . Kidney stones   . Liver cyst right   hepatic lobe cyst  . Myocardial infarction [facility]) 2007   heart bypass 2007  . Vitamin B 12 deficiency     MEDICATIONS:  Prior to Admission medications   Medication Sig Start Date End Date Taking? Authorizing Provider  amLODipine (NORVASC) 5 MG tablet Take 5 mg by mouth daily.    [provider]   aspirin EC 81 MG tablet Take 81 mg by mouth daily.    [provider]  cyanocobalamin 1000 MCG tablet Take 1,000 mcg by mouth daily.     [provider]  losartan (COZAAR) 100 MG tablet Take 100 mg by mouth daily.  02/05/12   [provider]  metoprolol tartrate (LOPRESSOR) 25 MG tablet Take 25 mg by mouth daily.  02/05/12   [provider]  nitroGLYCERIN (NITROSTAT) 0.4 MG SL tablet Place 0.4 mg under the tongue every 5 (five) minutes as needed for chest pain.    [provider]  omeprazole (PRILOSEC) 20 MG capsule Take 20 mg by mouth daily.    [provider]  simvastatin (ZOCOR) 20 MG tablet Take 20 mg by mouth daily.  02/05/12   [provider]    ALLERGIES:  Allergies  Allergen Reactions  . Codeine Nausea And Vomiting  . Morphine And Related Nausea And Vomiting    SOCIAL HISTORY:  Social History   Tobacco Use  . Smoking status: Former Smoker    Types: Cigarettes    Last attempt to quit: 05/30/1968    Years since quitting: 50.0  . Smokeless tobacco: Never Used  Substance Use Topics  . Alcohol use: No    FAMILY HISTORY: Family History  Problem Relation Age of Onset  .  Colon cancer Maternal Grandfather 60  . Diabetes Maternal Grandmother   . Heart disease Maternal Grandmother   . Stomach cancer Neg Hx     EXAM: BP (!) 143/84 (BP Location: Right Arm)   Pulse 77   Temp 98.1 F (36.7 C) (Oral)   Resp 18   Ht 5\' 11"  (1.803 m)   Wt 106.6 kg   SpO2 100%   BMI 32.78 kg/m  CONSTITUTIONAL: Alert and oriented and responds appropriately to questions. Well-appearing; well-nourished HEAD: Normocephalic EYES: Conjunctivae clear, pupils appear equal, EOMI ENT: normal nose; moist mucous membranes, normal speech, no stridor, no drooling, no trismus, no angioedema NECK: Supple, no meningismus, no nuchal rigidity, no LAD  CARD: RRR; S1 and S2 appreciated; no murmurs, no clicks, no rubs, no gallops RESP: Normal chest  excursion without splinting or tachypnea; breath sounds clear and equal bilaterally; no wheezes, no rhonchi, no rales, no hypoxia or respiratory distress, speaking full sentences ABD/GI: Normal bowel sounds; non-distended; soft, non-tender, no rebound, no guarding, no peritoneal signs, no hepatosplenomegaly BACK:  The back appears normal and is non-tender to palpation, there is no CVA tenderness EXT: Normal ROM in all joints; non-tender to palpation; no edema; normal capillary refill; no cyanosis, no calf tenderness or swelling    SKIN: Normal color for age and race; warm; no rash NEURO: Moves all extremities equally PSYCH: The patient's mood and manner are appropriate. Grooming and personal hygiene are appropriate.  MEDICAL DECISION MAKING: Patient here with brief episode of feeling like he could not catch his breath that occurred at 3:45 AM and lasted less than 10 seconds.  Now completely asymptomatic.  No other associated symptoms.  Work-up here has been unremarkable including negative troponin, clear chest x-ray and normal EKG.  He has no signs of angioedema, stridor, trismus, drooling.  No signs of an allergic reaction.  Denies any new exposures.  I doubt that this was ACS, PE, dissection.  He does not appear volume overloaded.  No pneumonia or pneumothorax seen.  Could have been from obstructive sleep apnea.  I do not think that there is anything life-threatening present today and have recommended he follow-up with his primary care physician.  He has follow-up with his cardiologist scheduled in February.  We discussed at length return precautions.  Patient and wife are comfortable with this plan.  At this time, I do not feel there is any life-threatening condition present. I have reviewed and discussed all results (EKG, imaging, lab, urine as appropriate) and exam findings with patient/family. I have reviewed nursing notes and appropriate previous records.  I feel the patient is safe to be discharged  home without further emergent workup and can continue workup as an outpatient as needed. Discussed usual and customary return precautions. Patient/family verbalize understanding and are comfortable with this plan.  Outpatient follow-up has been provided as needed. All questions have been answered.      EKG Interpretation  Date/Time:  Tuesday May 29 2018 04:44:47 EST Ventricular Rate:  83 PR Interval:  172 QRS Duration: 90 QT Interval:  374 QTC Calculation: 439 R Axis:   15 Text Interpretation:  Normal sinus rhythm Normal ECG No significant change since last tracing Confirmed by Chevon Fomby, Cyril Mourning [id number]) on 05/29/2018 5:52:54 AM         Aila Terra, Delice Bison, DO 05/29/18 [id number]

## 2018-05-29 NOTE — ED Notes (Signed)
Reviewed d/c instructions with pt, who verbalized understanding and had no outstanding questions. Pt departed in NAD, refused use of wheelchair.   

## 2018-07-04 NOTE — Progress Notes (Signed)
Cardiology Office Note   Date:  07/06/2018   ID:  Vincente, Asbridge [birth year], MRN [id number]  PCP:  [name], MD  Cardiologist:   Donabelle Molden [place], MD   Chief Complaint  Patient presents with  . Coronary Artery Disease  . Shortness of Breath      History of Present Illness: Travin Marik Gentzler is a 73 y.o. male who is seen for follow up  of CAD s/p CABG in 2007 by Dr. Servando Snare. This included a sequential SVG to the PDA and PLOM and LIMA to the LAD.  He also has a history of HTN and Hyperlipidemia.   He was seen in the ED on 12//31/19 with an episode of SOB. States he awoke suddenly from sleep and couldn't breathe. Felt like his throat was closing up. Lasted about 5 minutes. No tongue swelling.  Symptoms resolved. CXR and Ecg were OK. Labs remarkable for creatinine 1.28.   Since then he has no further dyspnea. No chest pain. Is active. Notes some myalgias. No edema.    Past Medical History:  Diagnosis Date  . Bilateral carpal tunnel syndrome   . Bladder cancer ([place]) 2005   bladder cancer in situ 2005  . CAD (coronary artery disease)   . Diverticulosis   . Fatty liver   . GERD (gastroesophageal reflux disease)   . Hyperlipidemia   . Hypertension   . Kidney stones   . Liver cyst right   hepatic lobe cyst  . Myocardial infarction [facility]) 2007   heart bypass 2007  . Vitamin B 12 deficiency     Past Surgical History:  Procedure Laterality Date  . BLADDER SURGERY  2005  . heart bypass  2007   3 vessels  . LUMBAR LAMINECTOMY       Current Outpatient Medications  Medication Sig Dispense Refill  . amLODipine (NORVASC) 5 MG tablet Take 5 mg by mouth daily.    [name] aspirin EC 81 MG tablet Take 81 mg by mouth daily.    . cyanocobalamin 1000 MCG tablet Take 1,000 mcg by mouth daily.     [name] losartan (COZAAR) 100 MG tablet Take 100 mg by mouth daily.     . metoprolol tartrate (LOPRESSOR) 25 MG tablet Take 25 mg by mouth daily.     . nitroGLYCERIN (NITROSTAT) 0.4 MG SL  tablet Place 0.4 mg under the tongue every 5 (five) minutes as needed for chest pain.    [name] omeprazole (PRILOSEC) 20 MG capsule Take 20 mg by mouth daily.     No current facility-administered medications for this visit.     Allergies:   Codeine and Morphine and related    Social History:  The patient  reports that he quit smoking about 50 years ago. His smoking use included cigarettes. He has never used smokeless tobacco. He reports that he does not drink alcohol or use drugs.   Family History:  The patient's family history includes Colon cancer (age of onset: 69) in his maternal grandfather; Diabetes in his maternal grandmother; Heart disease in his maternal grandmother.    ROS:  Please see the history of present illness.   Otherwise, review of systems are positive for none.   All other systems are reviewed and negative.    PHYSICAL EXAM: VS:  BP 126/80   Pulse 78   Ht 5\' 11"  (1.803 m)   Wt 242 lb (109.8 kg)   SpO2 95%   BMI 33.75 kg/m  , BMI Body mass  index is 33.75 kg/m. GENERAL:  Well appearing overweight WM in NAD. HEENT:  PERRL, EOMI, sclera are clear. Oropharynx is clear. NECK:  No jugular venous distention, carotid upstroke brisk and symmetric, no bruits, no thyromegaly or adenopathy LUNGS:  Clear to auscultation bilaterally CHEST:  Unremarkable HEART:  RRR,  PMI not displaced or sustained,S1 and S2 within normal limits, no S3, no S4: no clicks, no rubs, no murmurs ABD:  Soft, nontender. BS +, no masses or bruits. No hepatomegaly, no splenomegaly EXT:  2 + pulses throughout, no edema, no cyanosis no clubbing SKIN:  Warm and dry.  No rashes NEURO:  Alert and oriented x 3. Cranial nerves II through XII intact. PSYCH:  Cognitively intact    EKG:  EKG is not ordered today. Dated 05/29/18 was normal. I have personally reviewed and interpreted this study.    Recent Labs: 05/29/2018: BUN 18; Creatinine, Ser 1.28; Hemoglobin 13.4; Platelets 162; Potassium 3.9; Sodium 142      Lipid Panel No results found for: CHOL, TRIG, HDL, CHOLHDL, VLDL, LDLCALC, LDLDIRECT    Wt Readings from Last 3 Encounters:  07/06/18 242 lb (109.8 kg)  05/29/18 235 lb (106.6 kg)  06/29/17 243 lb (110.2 kg)     Labs dated 02/05/16: cholesterol 126, triglycerides 91, LDL 74, HDL 34. A1c 5.2%. CMET, TSH, CBC normal Dated 02/09/17: cholesterol 130, triglycerides 111, HDL 33, LDL 75. A1c 5.2%. Creatinine 1.2. CBC and ALT normal. Dated 03/09/18: cholesterol 116, triglycerides 86, HDL 27. LDL 72. ALT normal. A1c 5.3%.  Dated 05/29/18: creatinine 1.28. CBC normal.   Other studies Reviewed: Additional studies/ records that were reviewed today include: Myoview 06/23/15: Review of the above records demonstrates: Study Highlights    Nuclear stress EF: 51%.  The left ventricular ejection fraction is mildly decreased (45-54%).  There was no ST segment deviation noted during stress.  No T wave inversion was noted during stress.  Defect 1: There is a small defect of mild severity present in the basal inferior location.  This is a low risk study.   Low risk stress nuclear study with a small fixed basal inferior fixed defect (small scar versus diaphragmatic attenuation artifact). No reversible ischemia is seen. Borderline left ventricular global systolic function      ASSESSMENT AND PLAN:  1.  CAD s/p CABG 2007. Myoview Jan 2017 was low risk. He is asymptomatic. Will continue with medical therapy.  2. HTN controlled.  3. Hyperlipidemia. Under control on Zocor. Some myalgias. I am concerned about interaction of Zocor with amlodipine. Will switch Zocor to Crestor 10 mg daily  4. Episode of SOB - etiology unclear and work up in ED unremarkable. No recurrence.     Disposition:   FU with me one year  Signed, Memphis Decoteau [place], MD  07/06/2018 9:33 AM    [facility] [address] [place], [place], [address] Phone [phone], Fax [phone]

## 2018-07-06 ENCOUNTER — Encounter: Payer: Self-pay | Admitting: Cardiology

## 2018-07-06 ENCOUNTER — Ambulatory Visit: Payer: Medicare Other | Admitting: Cardiology

## 2018-07-06 VITALS — BP 126/80 | HR 78 | Ht 71.0 in | Wt 242.0 lb

## 2018-07-06 DIAGNOSIS — I10 Essential (primary) hypertension: Secondary | ICD-10-CM

## 2018-07-06 DIAGNOSIS — I25810 Atherosclerosis of coronary artery bypass graft(s) without angina pectoris: Secondary | ICD-10-CM | POA: Diagnosis not present

## 2018-07-06 DIAGNOSIS — E7800 Pure hypercholesterolemia, unspecified: Secondary | ICD-10-CM

## 2018-07-06 MED ORDER — ROSUVASTATIN CALCIUM 10 MG PO TABS: 10.0000 mg | ORAL_TABLET | Freq: Every day | ORAL | 3 refills | Status: DC

## 2018-07-06 NOTE — Patient Instructions (Signed)
Switch simvastatin to Crestor 10 mg daily  Continue your other therapy

## 2019-01-27 ENCOUNTER — Encounter (HOSPITAL_COMMUNITY): Payer: Self-pay | Admitting: Emergency Medicine

## 2019-01-27 ENCOUNTER — Other Ambulatory Visit: Payer: Self-pay

## 2019-01-27 ENCOUNTER — Emergency Department (HOSPITAL_COMMUNITY)
Admission: EM | Admit: 2019-01-27 | Discharge: 2019-01-27 | Disposition: A | Discharge status: Home / Self Care | Payer: Medicare Other | Attending: Emergency Medicine | Admitting: Emergency Medicine

## 2019-01-27 ENCOUNTER — Emergency Department (HOSPITAL_COMMUNITY): Payer: Medicare Other

## 2019-01-27 DIAGNOSIS — Z87891 Personal history of nicotine dependence: Secondary | ICD-10-CM | POA: Diagnosis not present

## 2019-01-27 DIAGNOSIS — Z7982 Long term (current) use of aspirin: Secondary | ICD-10-CM | POA: Insufficient documentation

## 2019-01-27 DIAGNOSIS — I10 Essential (primary) hypertension: Secondary | ICD-10-CM | POA: Insufficient documentation

## 2019-01-27 DIAGNOSIS — R42 Dizziness and giddiness: Secondary | ICD-10-CM | POA: Insufficient documentation

## 2019-01-27 DIAGNOSIS — I259 Chronic ischemic heart disease, unspecified: Secondary | ICD-10-CM | POA: Diagnosis not present

## 2019-01-27 DIAGNOSIS — Z8551 Personal history of malignant neoplasm of bladder: Secondary | ICD-10-CM | POA: Insufficient documentation

## 2019-01-27 DIAGNOSIS — Z79899 Other long term (current) drug therapy: Secondary | ICD-10-CM | POA: Insufficient documentation

## 2019-01-27 LAB — BASIC METABOLIC PANEL WITH GFR
Anion gap: 8 (ref 5–15)
BUN: 24 mg/dL — ABNORMAL HIGH (ref 8–23)
CO2: 23 mmol/L (ref 22–32)
Calcium: 8.7 mg/dL — ABNORMAL LOW (ref 8.9–10.3)
Chloride: 109 mmol/L (ref 98–111)
Creatinine, Ser: 1.24 mg/dL (ref 0.61–1.24)
GFR calc Af Amer: >60 mL/min (ref >60)
GFR calc non Af Amer: 58 mL/min — ABNORMAL LOW (ref >60)
Glucose, Bld: 118 mg/dL — ABNORMAL HIGH (ref 70–99)
Potassium: 3.8 mmol/L (ref 3.5–5.1)
Sodium: 140 mmol/L (ref 135–145)

## 2019-01-27 LAB — CBC
HCT: 39.4 % (ref 39.0–52.0)
Hemoglobin: 13.4 g/dL (ref 13.0–17.0)
MCH: 30.7 pg (ref 26.0–34.0)
MCHC: 34 g/dL (ref 30.0–36.0)
MCV: 90.2 fL (ref 80.0–100.0)
Platelets: 151 K/uL (ref 150–400)
RBC: 4.37 MIL/uL (ref 4.22–5.81)
RDW: 13.3 % (ref 11.5–15.5)
WBC: 6.7 K/uL (ref 4.0–10.5)
nRBC: 0 % (ref 0.0–0.2)

## 2019-01-27 LAB — TROPONIN I (HIGH SENSITIVITY)
Troponin I (High Sensitivity): 4 ng/L (ref <18)
Troponin I (High Sensitivity): 5 ng/L (ref <18)

## 2019-01-27 MED ORDER — ONDANSETRON 4 MG PO TBDP: 4.0000 mg | ORAL_TABLET | Freq: Three times a day (TID) | ORAL | 0 refills | Status: DC | PRN

## 2019-01-27 MED ORDER — MECLIZINE HCL 25 MG PO TABS
25.0000 mg | ORAL_TABLET | Freq: Once | ORAL | Status: AC
  Administered 2019-01-27: 25 mg via ORAL
  Filled 2019-01-27: qty 1

## 2019-01-27 MED ORDER — ONDANSETRON 4 MG PO TBDP
4.0000 mg | ORAL_TABLET | Freq: Once | ORAL | Status: AC
  Administered 2019-01-27: 4 mg via ORAL
  Filled 2019-01-27: qty 1

## 2019-01-27 MED ORDER — SODIUM CHLORIDE 0.9% FLUSH: 3.0000 mL | Freq: Once | INTRAVENOUS | Status: DC

## 2019-01-27 MED ORDER — MECLIZINE HCL 25 MG PO TABS: 25.0000 mg | ORAL_TABLET | Freq: Three times a day (TID) | ORAL | 0 refills | Status: DC | PRN

## 2019-01-27 NOTE — ED Provider Notes (Signed)
[facility] EMERGENCY DEPARTMENT Provider Note   CSN: [id number] Arrival date & time: 01/27/19  0414     History   Chief Complaint Chief Complaint  Patient presents with  . Dizziness    HPI Kyle Baldwin is a 73 y.o. male.     HPI  This is a 73 year old male with history of coronary artery disease, hypertension, hyperlipidemia who presents with dizziness.  Patient reports that he sat up in bed to go to the bathroom when he had acute onset of dizziness.  He states he felt associated nausea and sweating.  No chest pain or shortness of breath at that time.  No known history of vertigo.  Denies room spinning dizziness.  However, he states he had an episode night before last when he rolled over in bed" I felt like I was going to fall."  He denies any weakness, numbness, tingling, strokelike symptoms.  He denies any fever, recent illnesses, cough, shortness of breath, chest pain.  Currently he only reports residual nausea.  Past Medical History:  Diagnosis Date  . Bilateral carpal tunnel syndrome   . Bladder cancer ([place]) 2005   bladder cancer in situ 2005  . CAD (coronary artery disease)   . Diverticulosis   . Fatty liver   . GERD (gastroesophageal reflux disease)   . Hyperlipidemia   . Hypertension   . Kidney stones   . Liver cyst right   hepatic lobe cyst  . Myocardial infarction [facility]) 2007   heart bypass 2007  . Vitamin B 12 deficiency     Patient Active Problem List   Diagnosis Date Noted  . CAD (coronary artery disease) of artery bypass graft 06/05/2015  . HTN (hypertension) 06/05/2015  . Hyperlipidemia 06/05/2015    Past Surgical History:  Procedure Laterality Date  . BLADDER SURGERY  2005  . heart bypass  2007   3 vessels  . LUMBAR LAMINECTOMY          Home Medications    Prior to Admission medications   Medication Sig Start Date End Date Taking? Authorizing Provider  amLODipine (NORVASC) 5 MG tablet Take 5 mg by mouth daily.   Yes  [provider]  aspirin EC 81 MG tablet Take 81 mg by mouth daily.   Yes [provider]  cyanocobalamin 1000 MCG tablet Take 1,000 mcg by mouth daily.    Yes [provider]  losartan (COZAAR) 100 MG tablet Take 100 mg by mouth daily.  02/05/12  Yes [provider]  metoprolol tartrate (LOPRESSOR) 25 MG tablet Take 25 mg by mouth daily.  02/05/12  Yes [provider]  nitroGLYCERIN (NITROSTAT) 0.4 MG SL tablet Place 0.4 mg under the tongue every 5 (five) minutes as needed for chest pain.   Yes [provider]  omeprazole (PRILOSEC) 20 MG capsule Take 20 mg by mouth daily.   Yes [provider]  simvastatin (ZOCOR) 20 MG tablet Take 20 mg by mouth daily. 11/30/18  Yes [provider]  vitamin C (ASCORBIC ACID) 500 MG tablet Take 1,000 mg by mouth daily.   Yes [provider]  meclizine (ANTIVERT) 25 MG tablet Take 1 tablet (25 mg total) by mouth 3 (three) times daily as needed for dizziness. 01/27/19   Horton, Barbette Hair, MD  ondansetron (ZOFRAN ODT) 4 MG disintegrating tablet Take 1 tablet (4 mg total) by mouth every 8 (eight) hours as needed for nausea or vomiting. 01/27/19   Horton, Barbette Hair, MD  rosuvastatin (CRESTOR) 10 MG tablet Take 1 tablet (10 mg total) by mouth daily. Patient not taking: Reported on 01/27/2019 07/06/18 07/01/19  Martinique, Peter M, MD    Family History Family History  Problem Relation Age of Onset  . Colon cancer Maternal Grandfather 23  . Diabetes Maternal Grandmother   . Heart disease Maternal Grandmother   . Stomach cancer Neg Hx     Social History Social History   Tobacco Use  . Smoking status: Former Smoker    Types: Cigarettes    Quit date: 05/30/1968    Years since quitting: 50.6  . Smokeless tobacco: Never Used  Substance Use Topics  . Alcohol use: No  . Drug use: No     Allergies   Codeine and Morphine and related   Review of Systems Review of Systems  Constitutional:  Negative for fever.  Respiratory: Negative for shortness of breath.   Cardiovascular: Negative for chest pain.  Gastrointestinal: Negative for abdominal pain.  Genitourinary: Negative for dysuria.  Neurological: Positive for dizziness and light-headedness. Negative for speech difficulty, weakness, numbness and headaches.  All other systems reviewed and are negative.    Physical Exam Updated Vital Signs BP 109/68   Pulse (!) 57   Temp 98.2 F (36.8 C) (Oral)   Resp (!) 23   Ht 1.829 m (6')   Wt 107 kg   SpO2 95%   BMI 31.99 kg/m   Physical Exam Vitals signs and nursing note reviewed.  Constitutional:      Appearance: Normal appearance. He is well-developed. He is not ill-appearing.  HENT:     Head: Normocephalic and atraumatic.     Nose: Nose normal.  Eyes:     Extraocular Movements: Extraocular movements intact.     Pupils: Pupils are equal, round, and reactive to light.  Neck:     Musculoskeletal: Neck supple.  Cardiovascular:     Rate and Rhythm: Normal rate and regular rhythm.     Heart sounds: Normal heart sounds. No murmur.     Comments: Sternal scar present Pulmonary:     Effort: Pulmonary effort is normal. No respiratory distress.     Breath sounds: Normal breath sounds. No wheezing.  Abdominal:     General: Bowel sounds are normal.     Palpations: Abdomen is soft.     Tenderness: There is no abdominal tenderness. There is no rebound.  Musculoskeletal:        General: No swelling.  Lymphadenopathy:     Cervical: No cervical adenopathy.  Skin:    General: Skin is warm and dry.  Neurological:     Mental Status: He is alert and oriented to person, place, and time.     Comments: Cranial nerves II through XII intact, 5 out of 5 strength in all 4 extremities, no dysmetria to finger-nose-finger  Psychiatric:        Mood and Affect: Mood normal.      ED Treatments / Results  Labs (all labs ordered are listed, but only abnormal results are displayed) Labs  Reviewed  BASIC METABOLIC PANEL - Abnormal; Notable for the following components:      Result Value   Glucose, Bld 118 (*)    BUN 24 (*)    Calcium 8.7 (*)    GFR calc non Af Amer 58 (*)    All other components within normal limits  CBC  TROPONIN I (HIGH SENSITIVITY)  TROPONIN I (HIGH SENSITIVITY)    EKG None ED ECG REPORT  Date: 01/27/2019  Rate: 66  Rhythm: normal sinus rhythm  QRS Axis: normal  Intervals: normal  ST/T Wave abnormalities: nonspecific T wave changes  Conduction Disutrbances:none  Narrative Interpretation:   Old EKG Reviewed: unchanged  I have personally reviewed the EKG tracing and agree with the computerized printout as noted.  Radiology Dg Chest 2 View  Result Date: 01/27/2019 CLINICAL DATA:  Dizziness.  Nausea.  Hypertension. EXAM: CHEST - 2 VIEW COMPARISON:  05/29/2018 FINDINGS: Moderate thoracic spondylosis. Prior median sternotomy. Numerous leads and wires project over the chest. Midline trachea. Normal heart size and mediastinal contours. No pleural effusion or pneumothorax. Clear lungs. IMPRESSION: No active cardiopulmonary disease. Electronically Signed   By: Abigail Miyamoto M.D.   On: 01/27/2019 05:00    Procedures Procedures (including critical care time)  Medications Ordered in ED Medications  sodium chloride flush (NS) 0.9 % injection 3 mL (3 mLs Intravenous Not Given 01/27/19 0740)  meclizine (ANTIVERT) tablet 25 mg (25 mg Oral Given 01/27/19 [id number])  ondansetron (ZOFRAN-ODT) disintegrating tablet 4 mg (4 mg Oral Given 01/27/19 [id number])     Initial Impression / Assessment and Plan / ED Course  I have reviewed the triage vital signs and the nursing notes.  Pertinent labs & imaging results that were available during my care of the patient were reviewed by me and considered in my medical decision making (see chart for details).  Clinical Course as of Jan 27 756  Nancy Fetter Jan 27, 2019  0634 Patient reports he feels better after meclizine and Zofran.   We will continue to monitor.  Initial work-up is reassuring.   [CH]    Clinical Course User Index [CH] Horton, Barbette Hair, MD       Patient presents with dizziness.  He is overall nontoxic and vital signs are reassuring.  EKG is nonischemic.  Very atypical for ACS.  Much more suggestive of vertigo.  He has no signs of central cause.  Patient was given meclizine and Zofran.  Work-up cleaning basic metabolic panel and labs reassuring.  Troponin x2-.  Patient had market improvement of symptoms and was ambulatory independently.  Suspect benign peripheral vertigo.  After history, exam, and medical workup I feel the patient has been appropriately medically screened and is safe for discharge home. Pertinent diagnoses were discussed with the patient. Patient was given return precautions.   Final Clinical Impressions(s) / ED Diagnoses   Final diagnoses:  Vertigo    ED Discharge Orders         Ordered    ondansetron (ZOFRAN ODT) 4 MG disintegrating tablet  Every 8 hours PRN     01/27/19 0734    meclizine (ANTIVERT) 25 MG tablet  3 times daily PRN     01/27/19 0734           Merryl Hacker, MD 01/27/19 [phone]

## 2019-01-27 NOTE — ED Triage Notes (Addendum)
Pt transported from home by [facility] with c/o dizziness, nausea. IV est, pt declined Zofran.  Pt states he woke up to use restroom and noted room spinning, nausea and diaphoresis. Pt states he did have dizziness the night before as well. Pt A & O, neuro intact.

## 2019-01-27 NOTE — Discharge Instructions (Signed)
Your seen today for dizziness.  This is likely related to vertigo.  You will be discharged with some meclizine and Zofran.  If you have recurrent symptoms take these medications.  If you develop strokelike symptoms you should be reevaluated immediately.

## 2019-03-15 DIAGNOSIS — R7301 Impaired fasting glucose: Secondary | ICD-10-CM | POA: Diagnosis not present

## 2019-03-15 DIAGNOSIS — E7849 Other hyperlipidemia: Secondary | ICD-10-CM | POA: Diagnosis not present

## 2019-03-15 DIAGNOSIS — Z125 Encounter for screening for malignant neoplasm of prostate: Secondary | ICD-10-CM | POA: Diagnosis not present

## 2019-03-15 DIAGNOSIS — E538 Deficiency of other specified B group vitamins: Secondary | ICD-10-CM | POA: Diagnosis not present

## 2019-03-19 LAB — IFOBT (OCCULT BLOOD): IFOBT: NEGATIVE

## 2019-07-06 NOTE — Progress Notes (Signed)
Cardiology Office Note   Date:  07/08/2019   ID:  Kyle Baldwin, Kyle Baldwin 05/29/1946, MRN [id number]  PCP:  [name], MD  Cardiologist:   Bueford Arp [place], MD   Chief Complaint  Patient presents with  . Coronary Artery Disease      History of Present Illness: Kyle Baldwin is a 74 y.o. male who is seen for follow up  of CAD s/p CABG in 2007 by Dr. Servando Snare. This included a sequential SVG to the PDA and PLOM and LIMA to the LAD.  He also has a history of HTN and Hyperlipidemia.   He was seen in the ED on 12//31/19 with an episode of SOB. States he awoke suddenly from sleep and couldn't breathe. Felt like his throat was closing up. Lasted about 5 minutes. No tongue swelling.  Symptoms resolved. CXR and Ecg were OK. Labs remarkable for creatinine 1.28.   He was seen in the ED in August 2020 with vertigo. Evaluation was negative.   On follow up today he is feeling well. Not as active this year due to pandemic. He has gained weight. No chest pain, dyspnea, palpitations. Is active. Minimal myalgias.     Past Medical History:  Diagnosis Date  . Bilateral carpal tunnel syndrome   . Bladder cancer ([place]) 2005   bladder cancer in situ 2005  . CAD (coronary artery disease)   . Diverticulosis   . Fatty liver   . GERD (gastroesophageal reflux disease)   . Hyperlipidemia   . Hypertension   . Kidney stones   . Liver cyst right   hepatic lobe cyst  . Myocardial infarction [facility]) 2007   heart bypass 2007  . Vitamin B 12 deficiency     Past Surgical History:  Procedure Laterality Date  . BLADDER SURGERY  2005  . heart bypass  2007   3 vessels  . LUMBAR LAMINECTOMY       Current Outpatient Medications  Medication Sig Dispense Refill  . amLODipine (NORVASC) 5 MG tablet Take 5 mg by mouth daily.    [name] aspirin EC 81 MG tablet Take 81 mg by mouth daily.    . cyanocobalamin 1000 MCG tablet Take 1,000 mcg by mouth daily.     [name] losartan (COZAAR) 100 MG tablet Take 100 mg by mouth  daily.     . meclizine (ANTIVERT) 25 MG tablet Take 1 tablet (25 mg total) by mouth 3 (three) times daily as needed for dizziness. 30 tablet 0  . metoprolol tartrate (LOPRESSOR) 25 MG tablet Take 25 mg by mouth daily.     . nitroGLYCERIN (NITROSTAT) 0.4 MG SL tablet Place 0.4 mg under the tongue every 5 (five) minutes as needed for chest pain.    [name] omeprazole (PRILOSEC) 20 MG capsule Take 20 mg by mouth daily.    . ondansetron (ZOFRAN ODT) 4 MG disintegrating tablet Take 1 tablet (4 mg total) by mouth every 8 (eight) hours as needed for nausea or vomiting. 20 tablet 0  . simvastatin (ZOCOR) 20 MG tablet Take 20 mg by mouth daily.    . vitamin C (ASCORBIC ACID) 500 MG tablet Take 1,000 mg by mouth daily.     No current facility-administered medications for this visit.    Allergies:   Codeine and Morphine and related    Social History:  The patient  reports that he quit smoking about 51 years ago. His smoking use included cigarettes. He has never used smokeless tobacco. He reports that  he does not drink alcohol or use drugs.   Family History:  The patient's family history includes Colon cancer (age of onset: 57) in his maternal grandfather; Diabetes in his maternal grandmother; Heart disease in his maternal grandmother.    ROS:  Please see the history of present illness.   Otherwise, review of systems are positive for none.   All other systems are reviewed and negative.    PHYSICAL EXAM: VS:  BP 120/80   Pulse 69   Temp (!) 97.4 F (36.3 C)   Ht 6' (1.829 m)   Wt 246 lb 6.4 oz (111.8 kg)   SpO2 99%   BMI 33.42 kg/m  , BMI Body mass index is 33.42 kg/m. GENERAL:  Well appearing overweight WM in NAD. HEENT:  PERRL, EOMI, sclera are clear. Oropharynx is clear. NECK:  No jugular venous distention, carotid upstroke brisk and symmetric, no bruits, no thyromegaly or adenopathy LUNGS:  Clear to auscultation bilaterally CHEST:  Unremarkable HEART:  RRR,  PMI not displaced or sustained,S1  and S2 within normal limits, no S3, no S4: no clicks, no rubs, no murmurs ABD:  Soft, nontender. BS +, no masses or bruits. No hepatomegaly, no splenomegaly EXT:  2 + pulses throughout, no edema, no cyanosis no clubbing SKIN:  Warm and dry.  No rashes NEURO:  Alert and oriented x 3. Cranial nerves II through XII intact. PSYCH:  Cognitively intact    EKG:  EKG is not ordered today. Dated 01/27/19 was stable.  I have personally reviewed and interpreted this study.    Recent Labs: 01/27/2019: BUN 24; Creatinine, Ser 1.24; Hemoglobin 13.4; Platelets 151; Potassium 3.8; Sodium 140    Lipid Panel No results found for: CHOL, TRIG, HDL, CHOLHDL, VLDL, LDLCALC, LDLDIRECT    Wt Readings from Last 3 Encounters:  07/08/19 246 lb 6.4 oz (111.8 kg)  01/27/19 235 lb 14.3 oz (107 kg)  07/06/18 242 lb (109.8 kg)     Labs dated 02/05/16: cholesterol 126, triglycerides 91, LDL 74, HDL 34. A1c 5.2%. CMET, TSH, CBC normal Dated 02/09/17: cholesterol 130, triglycerides 111, HDL 33, LDL 75. A1c 5.2%. Creatinine 1.2. CBC and ALT normal. Dated 03/09/18: cholesterol 116, triglycerides 86, HDL 27. LDL 72. ALT normal. A1c 5.3%.  Dated 05/29/18: creatinine 1.28. CBC normal.  Dated 03/15/19: cholesterol 136, triglycerides 100, HDL 34, LDL 82. A1c 5.3%. creatinine 1.2. Otherwise CMET and CBC normal  Other studies Reviewed: Additional studies/ records that were reviewed today include: Myoview 06/23/15: Review of the above records demonstrates: Study Highlights    Nuclear stress EF: 51%.  The left ventricular ejection fraction is mildly decreased (45-54%).  There was no ST segment deviation noted during stress.  No T wave inversion was noted during stress.  Defect 1: There is a small defect of mild severity present in the basal inferior location.  This is a low risk study.   Low risk stress nuclear study with a small fixed basal inferior fixed defect (small scar versus diaphragmatic attenuation artifact).  No reversible ischemia is seen. Borderline left ventricular global systolic function      ASSESSMENT AND PLAN:  1.  CAD s/p CABG 2007. Myoview Jan 2017 was low risk. He is asymptomatic. Will continue with medical therapy.  2. HTN controlled.  3. Hyperlipidemia. Under fair control on Zocor. Minimal myalgias. LDL has increased a little related to weight gain. Recommend increased aerobic activity and weight loss.   Disposition:   FU with me one year  Signed, Collier Salina  [place], MD  07/08/2019 8:06 AM    [facility] [address] [place], [place], [address] Phone [phone], Fax [phone]

## 2019-07-08 ENCOUNTER — Ambulatory Visit: Payer: Medicare Other | Admitting: Cardiology

## 2019-07-08 ENCOUNTER — Encounter: Payer: Self-pay | Admitting: Cardiology

## 2019-07-08 ENCOUNTER — Other Ambulatory Visit: Payer: Self-pay

## 2019-07-08 VITALS — BP 120/80 | HR 69 | Temp 97.4°F | Ht 72.0 in | Wt 246.4 lb

## 2019-07-08 DIAGNOSIS — I25810 Atherosclerosis of coronary artery bypass graft(s) without angina pectoris: Secondary | ICD-10-CM

## 2019-07-08 DIAGNOSIS — I10 Essential (primary) hypertension: Secondary | ICD-10-CM

## 2019-07-08 DIAGNOSIS — E7800 Pure hypercholesterolemia, unspecified: Secondary | ICD-10-CM

## 2019-12-14 IMAGING — DX CHEST - 2 VIEW
2 series · 2 of 2 positions shown · non-contrast
Comparison: 05/29/2018

CLINICAL DATA: Dizziness.  Nausea.  Hypertension.

EXAM:
CHEST - 2 VIEW

[chest pa]
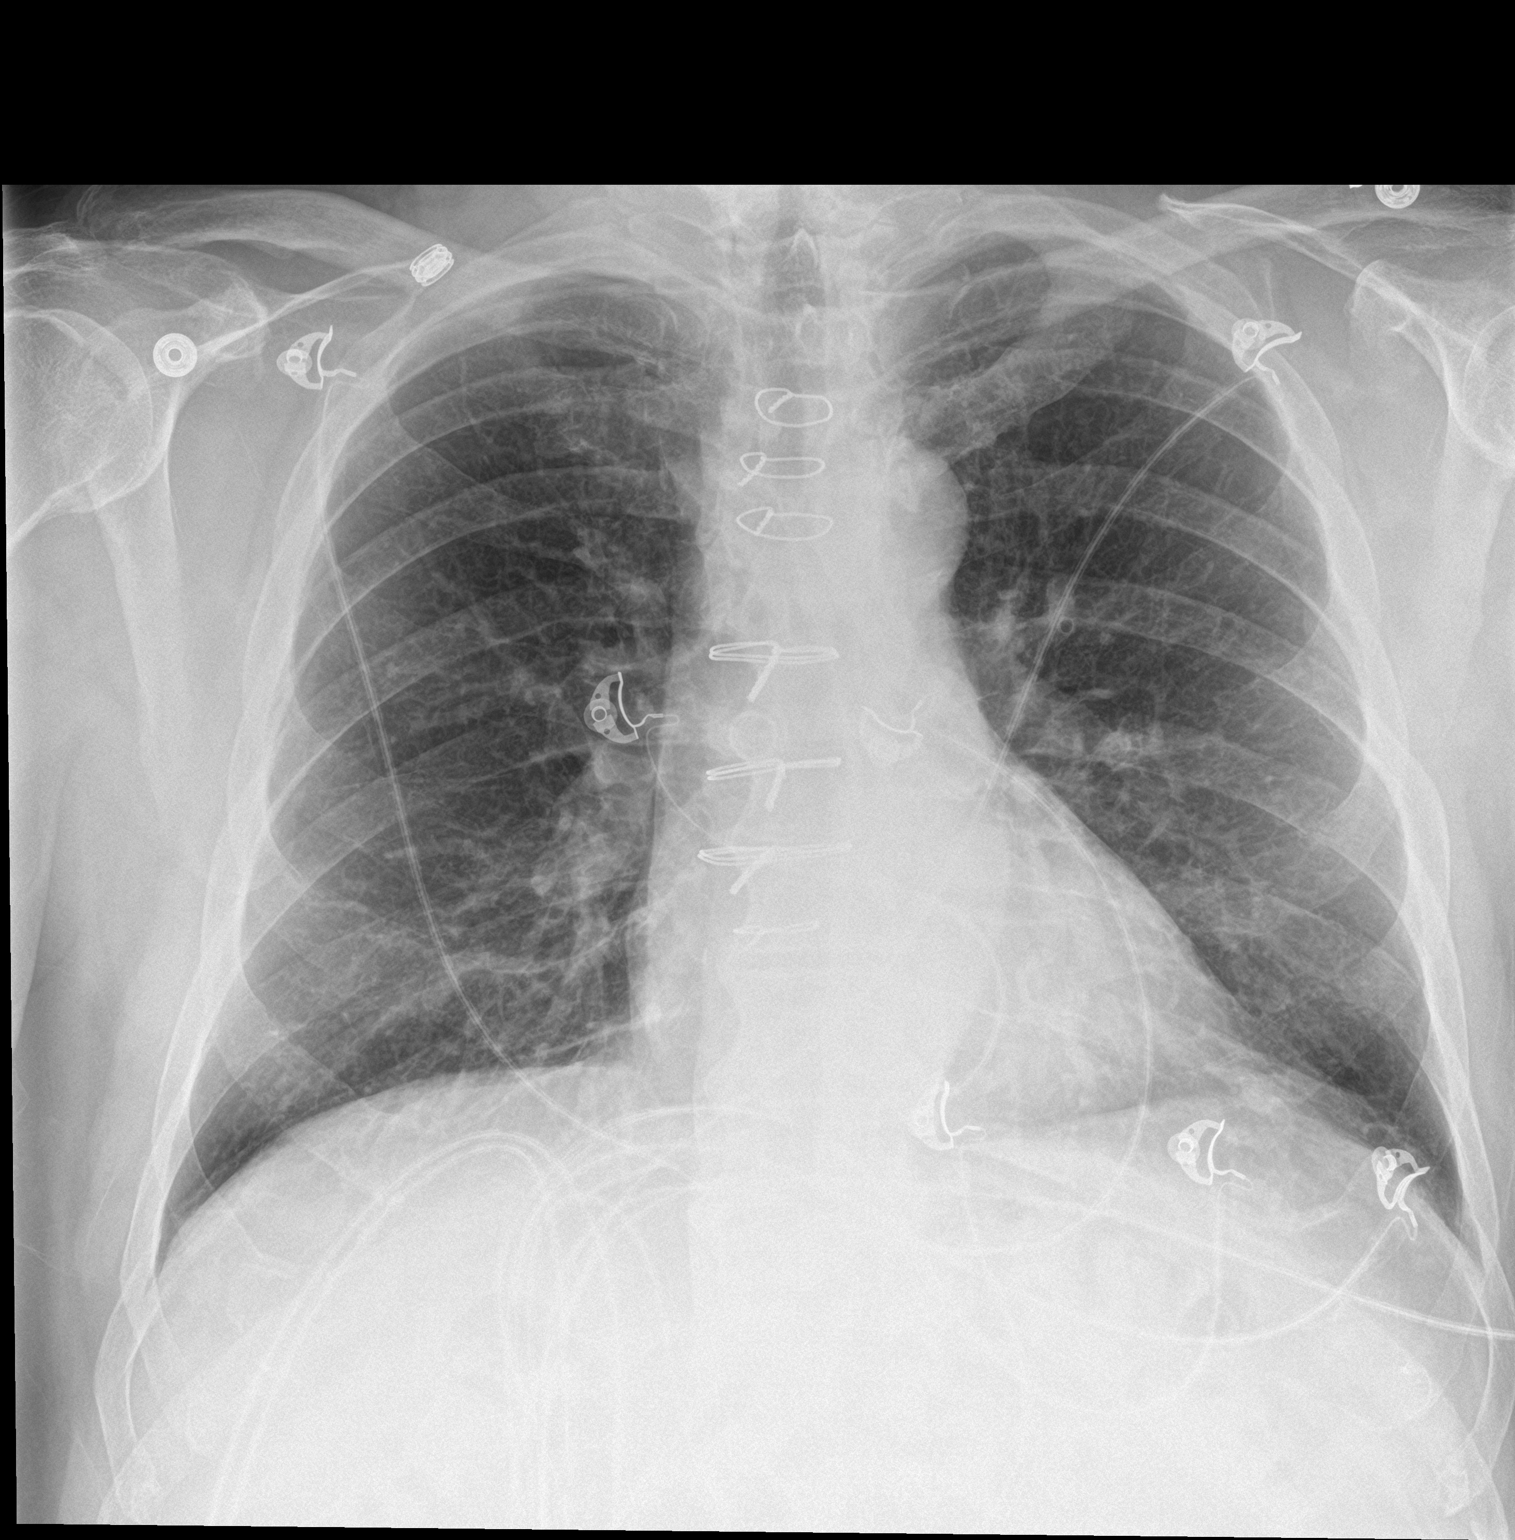

[chest lat]
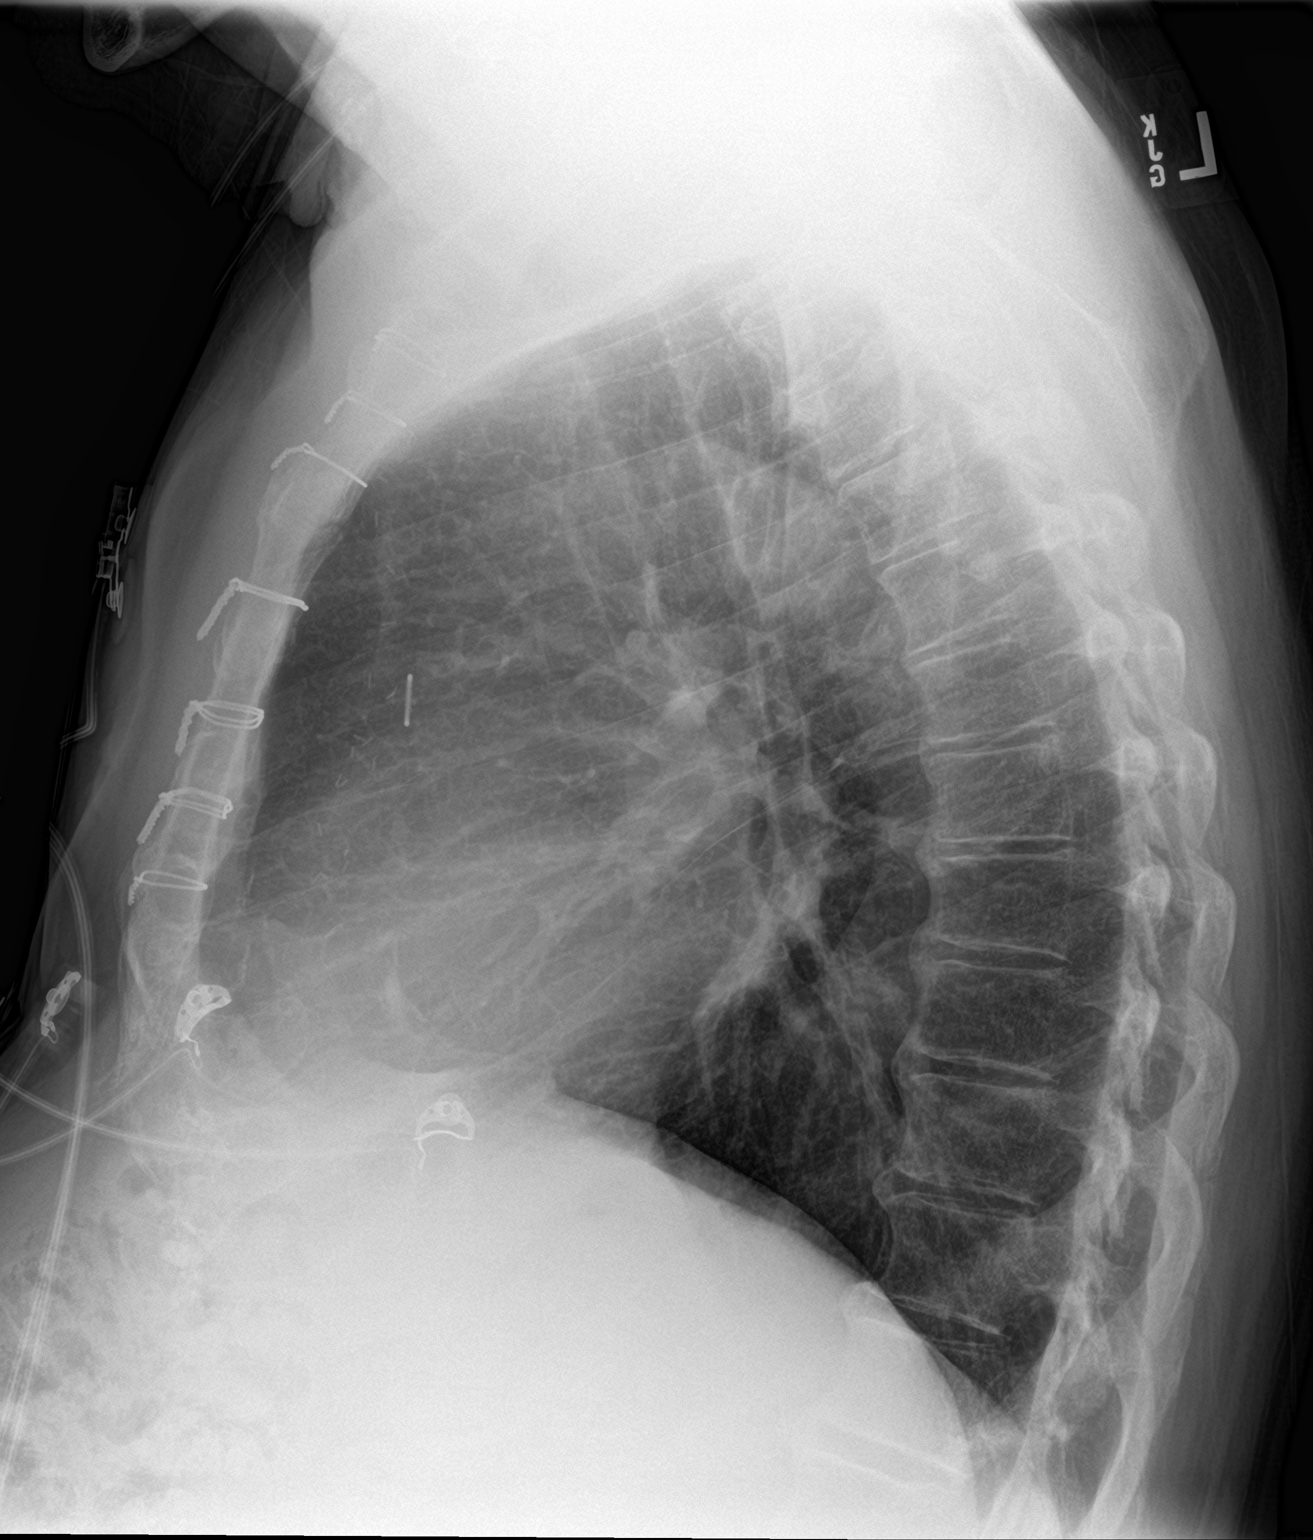

[2 of 2 positions shown; findings below may reference images not displayed]

FINDINGS: Moderate thoracic spondylosis.

Prior median sternotomy. Numerous leads and wires project over the
chest. Midline trachea. Normal heart size and mediastinal contours.
No pleural effusion or pneumothorax. Clear lungs.
IMPRESSION: No active cardiopulmonary disease.

## 2020-02-04 ENCOUNTER — Other Ambulatory Visit: Payer: Self-pay | Admitting: Physician Assistant

## 2020-02-04 ENCOUNTER — Telehealth: Payer: Self-pay | Admitting: Physician Assistant

## 2020-02-04 DIAGNOSIS — I25810 Atherosclerosis of coronary artery bypass graft(s) without angina pectoris: Secondary | ICD-10-CM

## 2020-02-04 DIAGNOSIS — I10 Essential (primary) hypertension: Secondary | ICD-10-CM

## 2020-02-04 DIAGNOSIS — U071 COVID-19: Secondary | ICD-10-CM

## 2020-02-04 NOTE — Telephone Encounter (Signed)
Called to discuss with patient about Covid symptoms and the use of casirivimab/imdevimab, a monoclonal antibody infusion for those with mild to moderate Covid symptoms and at a high risk of hospitalization.  Pt is qualified for this infusion at the [facility] center due to; Specific high risk criteria : Older age (>/= 72 yo) and Cardiovascular disease or hypertension   Message left to call back our hotline [phone] and sent a mychart message.  [name] Form PA-C  [facility]

## 2020-02-04 NOTE — Progress Notes (Signed)
I connected by phone with Kyle Baldwin on 02/04/2020 at 8:45 PM to discuss the potential use of a new treatment for mild to moderate COVID-19 viral infection in non-hospitalized patients.  This patient is a 74 y.o. male that meets the FDA criteria for Emergency Use Authorization of COVID monoclonal antibody casirivimab/imdevimab.  Has a (+) direct SARS-CoV-2 viral test result  Has mild or moderate COVID-19   Is NOT hospitalized due to COVID-19  Is within 10 days of symptom onset  Has at least one of the high risk factor(s) for progression to severe COVID-19 and/or hospitalization as defined in EUA.  Specific high risk criteria : Older age (>/= 72 yo) and Cardiovascular disease or hypertension   I have spoken and communicated the following to the patient or parent/caregiver regarding COVID monoclonal antibody treatment:  1. FDA has authorized the emergency use for the treatment of mild to moderate COVID-19 in adults and pediatric patients with positive results of direct SARS-CoV-2 viral testing who are 74 years of age and older weighing at least 40 kg, and who are at high risk for progressing to severe COVID-19 and/or hospitalization.  2. The significant known and potential risks and benefits of COVID monoclonal antibody, and the extent to which such potential risks and benefits are unknown.  3. Information on available alternative treatments and the risks and benefits of those alternatives, including clinical trials.  4. Patients treated with COVID monoclonal antibody should /continue to self-isolate and use infection control measures (e.g., wear mask, isolate, social distance, avoid sharing personal items, clean and disinfect "high touch" surfaces, and frequent handwashing) according to CDC guidelines.   5. The patient or parent/caregiver has the option to accept or refuse COVID monoclonal antibody treatment.  After reviewing this information with the patient, The patient agreed to  proceed with receiving casirivimab\imdevimab infusion and will be provided a copy of the Fact sheet prior to receiving the infusion.  Sx onset 9/2. Set up for infusion on 9/8 @ 10:30am. Directions given to [facility]. Pt is aware that insurance will be charged an infusion fee. Pt is fully vaccinated.   [name] Form 02/04/2020 8:45 PM

## 2020-02-05 ENCOUNTER — Encounter (HOSPITAL_COMMUNITY): Payer: Self-pay

## 2020-02-05 ENCOUNTER — Ambulatory Visit (HOSPITAL_COMMUNITY)
Admission: RE | Admit: 2020-02-05 | Discharge: 2020-02-05 | Disposition: A | Discharge status: Home / Self Care | Payer: Medicare Other | Source: Ambulatory Visit | Attending: Pulmonary Disease | Admitting: Pulmonary Disease

## 2020-02-05 DIAGNOSIS — I10 Essential (primary) hypertension: Secondary | ICD-10-CM | POA: Diagnosis present

## 2020-02-05 DIAGNOSIS — U071 COVID-19: Secondary | ICD-10-CM | POA: Insufficient documentation

## 2020-02-05 DIAGNOSIS — Z23 Encounter for immunization: Secondary | ICD-10-CM | POA: Insufficient documentation

## 2020-02-05 DIAGNOSIS — I25810 Atherosclerosis of coronary artery bypass graft(s) without angina pectoris: Secondary | ICD-10-CM

## 2020-02-05 MED ORDER — FAMOTIDINE IN NACL 20-0.9 MG/50ML-% IV SOLN: 20.0000 mg | Freq: Once | INTRAVENOUS | Status: DC | PRN

## 2020-02-05 MED ORDER — ALBUTEROL SULFATE HFA 108 (90 BASE) MCG/ACT IN AERS: 2.0000 | INHALATION_SPRAY | Freq: Once | RESPIRATORY_TRACT | Status: DC | PRN

## 2020-02-05 MED ORDER — SODIUM CHLORIDE 0.9 % IV SOLN: INTRAVENOUS | Status: DC | PRN

## 2020-02-05 MED ORDER — METHYLPREDNISOLONE SODIUM SUCC 125 MG IJ SOLR: 125.0000 mg | Freq: Once | INTRAMUSCULAR | Status: DC | PRN

## 2020-02-05 MED ORDER — EPINEPHRINE 0.3 MG/0.3ML IJ SOAJ: 0.3000 mg | Freq: Once | INTRAMUSCULAR | Status: DC | PRN

## 2020-02-05 MED ORDER — SODIUM CHLORIDE 0.9 % IV SOLN
1200.0000 mg | Freq: Once | INTRAVENOUS | Status: AC
  Administered 2020-02-05: 1200 mg via INTRAVENOUS
  Filled 2020-02-05: qty 10

## 2020-02-05 MED ORDER — DIPHENHYDRAMINE HCL 50 MG/ML IJ SOLN: 50.0000 mg | Freq: Once | INTRAMUSCULAR | Status: DC | PRN

## 2020-02-05 NOTE — Discharge Instructions (Signed)

## 2020-02-05 NOTE — Progress Notes (Signed)
  Diagnosis: COVID-19  Physician:Dr. Patrick Wright  Procedure: [facility] Infusion [facility] Med: casirivimab\imdevimab infusion - Provided patient with casirivimab\imdevimab fact sheet for patients, parents and caregivers prior to infusion.  Complications: No immediate complications noted.  Discharge: Discharged home   Kyle Baldwin 02/05/2020   

## 2020-03-05 ENCOUNTER — Telehealth: Payer: Self-pay | Admitting: Cardiology

## 2020-03-05 DIAGNOSIS — R002 Palpitations: Secondary | ICD-10-CM

## 2020-03-05 NOTE — Telephone Encounter (Signed)
Spoke to patient Dr.Jordan's advice given.Monitor tech will call with instructions on 1 week event monitor.Appointment scheduled with Dr.Jordan 10/21 at 8:20 am.

## 2020-03-05 NOTE — Telephone Encounter (Signed)
Patient c/o Palpitations:  High priority if patient c/o lightheadedness, shortness of breath, or chest pain  1) How long have you had palpitations/irregular HR/ Afib? Had an episode last week and this morning. Are you having the symptoms now? no  2) Are you currently experiencing lightheadedness, SOB or CP? Chest pressure, EMT did EKG that was normal   3) Do you have a history of afib (atrial fibrillation) or irregular heart rhythm? Not aware he does   4) Have you checked your BP or HR? (document readings if available): no   5) Are you experiencing any other symptoms? No

## 2020-03-05 NOTE — Telephone Encounter (Signed)
Lets go ahead and order a one week event monitor and then we can see him on Oct 21  Antoni Stefan Martinique MD, [facility]

## 2020-03-05 NOTE — Telephone Encounter (Signed)
Returned call to patient of Dr. Martinique He reports palpitations, felt like there were pauses He has had 2 isolated episodes in the past week, occurring in the morning He called EMS today - HR was 80-90bpm and EKG was normal, BP was fine He had COVID about 1 month ago, monoclonal infusion after (he was vaccinated back in Feb/March)  Advised will route to Dr. Martinique to review There are openings on 10/21 virtual clinic where there are already in-office visits scheduled

## 2020-03-09 ENCOUNTER — Ambulatory Visit (INDEPENDENT_AMBULATORY_CARE_PROVIDER_SITE_OTHER): Payer: Medicare Other

## 2020-03-09 DIAGNOSIS — R002 Palpitations: Secondary | ICD-10-CM | POA: Diagnosis not present

## 2020-03-15 NOTE — Progress Notes (Signed)
Cardiology Office Note   Date:  03/19/2020   ID:  [name], [name] 22-Dec-1945, MRN [id number]  PCP:  [name], MD  Cardiologist:   Shantoria Ellwood Martinique, MD   Chief Complaint  Patient presents with  . Coronary Artery Disease      History of Present Illness: Kyle Baldwin is a 74 y.o. male who is seen for follow up  of CAD s/p CABG in 2007 by Dr. Servando Snare. This included a sequential SVG to the PDA and PLOM and LIMA to the LAD.  He also has a history of HTN and Hyperlipidemia.   He was seen in the ED on 12//31/19 with an episode of SOB. States he awoke suddenly from sleep and couldn't breathe. Felt like his throat was closing up. Lasted about 5 minutes. No tongue swelling.  Symptoms resolved. CXR and Ecg were OK. Labs remarkable for creatinine 1.28.   He was seen in the ED in August 2020 with vertigo. Evaluation was negative.   He was diagnosed with COVID 19 in September 2021. Received monoclonal antibodies. Subsequently complained of palpitations. Event monitor placed.   On follow up today he is feeling well. No SOB. He has a slight indigestion feeling. Energy level is better and palpitations have improved. Event monitor showed rare PACs and PVCs. <1%. 4 brief runs of SVT longest 6 beats.     Past Medical History:  Diagnosis Date  . Bilateral carpal tunnel syndrome   . Bladder cancer ([place]) 2005   bladder cancer in situ 2005  . CAD (coronary artery disease)   . Diverticulosis   . Fatty liver   . GERD (gastroesophageal reflux disease)   . Hyperlipidemia   . Hypertension   . Kidney stones   . Liver cyst right   hepatic lobe cyst  . Myocardial infarction [facility]) 2007   heart bypass 2007  . Vitamin B 12 deficiency     Past Surgical History:  Procedure Laterality Date  . BLADDER SURGERY  2005  . heart bypass  2007   3 vessels  . LUMBAR LAMINECTOMY       Current Outpatient Medications  Medication Sig Dispense Refill  . amLODipine (NORVASC) 5 MG tablet Take 5  mg by mouth daily.    [name] aspirin EC 81 MG tablet Take 81 mg by mouth daily.    . cyanocobalamin 1000 MCG tablet Take 1,000 mcg by mouth daily.     [name] losartan (COZAAR) 100 MG tablet Take 100 mg by mouth daily.     . metoprolol tartrate (LOPRESSOR) 25 MG tablet Take 25 mg by mouth daily.     . nitroGLYCERIN (NITROSTAT) 0.4 MG SL tablet Place 0.4 mg under the tongue every 5 (five) minutes as needed for chest pain.    [name] omeprazole (PRILOSEC) 20 MG capsule Take 20 mg by mouth daily.    . simvastatin (ZOCOR) 20 MG tablet Take 20 mg by mouth daily.    . vitamin C (ASCORBIC ACID) 500 MG tablet Take 1,000 mg by mouth daily.     No current facility-administered medications for this visit.    Allergies:   Codeine and Morphine and related    Social History:  The patient  reports that he quit smoking about 51 years ago. His smoking use included cigarettes. He has never used smokeless tobacco. He reports that he does not drink alcohol and does not use drugs.   Family History:  The patient's family history includes Colon cancer (age of  onset: 36) in his maternal grandfather; Diabetes in his maternal grandmother; Heart disease in his maternal grandmother.    ROS:  Please see the history of present illness.   Otherwise, review of systems are positive for none.   All other systems are reviewed and negative.    PHYSICAL EXAM: VS:  BP 128/82   Pulse 66   Ht 6' (1.829 m)   Wt 239 lb (108.4 kg)   SpO2 98%   BMI 32.41 kg/m  , BMI Body mass index is 32.41 kg/m. GENERAL:  Well appearing overweight WM in NAD. HEENT:  PERRL, EOMI, sclera are clear. Oropharynx is clear. NECK:  No jugular venous distention, carotid upstroke brisk and symmetric, no bruits, no thyromegaly or adenopathy LUNGS:  Clear to auscultation bilaterally CHEST:  Unremarkable HEART:  RRR,  PMI not displaced or sustained,S1 and S2 within normal limits, no S3, no S4: no clicks, no rubs, no murmurs ABD:  Soft, nontender. BS +, no masses or  bruits. No hepatomegaly, no splenomegaly EXT:  2 + pulses throughout, no edema, no cyanosis no clubbing SKIN:  Warm and dry.  No rashes NEURO:  Alert and oriented x 3. Cranial nerves II through XII intact. PSYCH:  Cognitively intact    EKG:  EKG is ordered today. NSR rate 66. Normal.   I have personally reviewed and interpreted this study.    Recent Labs: No results found for requested labs within last 8760 hours.    Lipid Panel No results found for: CHOL, TRIG, HDL, CHOLHDL, VLDL, LDLCALC, LDLDIRECT    Wt Readings from Last 3 Encounters:  03/19/20 239 lb (108.4 kg)  07/08/19 246 lb 6.4 oz (111.8 kg)  01/27/19 235 lb 14.3 oz (107 kg)     Labs dated 02/05/16: cholesterol 126, triglycerides 91, LDL 74, HDL 34. A1c 5.2%. CMET, TSH, CBC normal Dated 02/09/17: cholesterol 130, triglycerides 111, HDL 33, LDL 75. A1c 5.2%. Creatinine 1.2. CBC and ALT normal. Dated 03/09/18: cholesterol 116, triglycerides 86, HDL 27. LDL 72. ALT normal. A1c 5.3%.  Dated 05/29/18: creatinine 1.28. CBC normal.  Dated 03/15/19: cholesterol 136, triglycerides 100, HDL 34, LDL 82. A1c 5.3%. creatinine 1.2. Otherwise CMET and CBC normal  Other studies Reviewed: Additional studies/ records that were reviewed today include: Myoview 06/23/15: Review of the above records demonstrates: Study Highlights    Nuclear stress EF: 51%.  The left ventricular ejection fraction is mildly decreased (45-54%).  There was no ST segment deviation noted during stress.  No T wave inversion was noted during stress.  Defect 1: There is a small defect of mild severity present in the basal inferior location.  This is a low risk study.   Low risk stress nuclear study with a small fixed basal inferior fixed defect (small scar versus diaphragmatic attenuation artifact). No reversible ischemia is seen. Borderline left ventricular global systolic function      ASSESSMENT AND PLAN:  1.  CAD s/p CABG 2007. Myoview Jan 2017 was  low risk. He is asymptomatic. Will continue with medical therapy.  2. HTN controlled.  3. Hyperlipidemia.  on Zocor. Scheduled for lab work with his general physical with Dr Philip Aspen this week.   4. Palpitations. Reviewed event monitor with really minimal ectopy. Will continue current dose of metoprolol. Reassurance given  Disposition:   FU with me 6 months.  Signed, Kia Varnadore Martinique, MD  03/19/2020 8:27 AM    [facility] [address] [place], [place], [address] Phone [phone], Fax [phone]

## 2020-03-19 ENCOUNTER — Encounter: Payer: Self-pay | Admitting: Cardiology

## 2020-03-19 ENCOUNTER — Ambulatory Visit: Payer: Medicare Other | Admitting: Cardiology

## 2020-03-19 ENCOUNTER — Other Ambulatory Visit: Payer: Self-pay

## 2020-03-19 VITALS — BP 128/82 | HR 66 | Ht 72.0 in | Wt 239.0 lb

## 2020-03-19 DIAGNOSIS — I10 Essential (primary) hypertension: Secondary | ICD-10-CM | POA: Diagnosis not present

## 2020-03-19 DIAGNOSIS — I25810 Atherosclerosis of coronary artery bypass graft(s) without angina pectoris: Secondary | ICD-10-CM

## 2020-03-19 DIAGNOSIS — R002 Palpitations: Secondary | ICD-10-CM | POA: Diagnosis not present

## 2020-03-19 DIAGNOSIS — E7800 Pure hypercholesterolemia, unspecified: Secondary | ICD-10-CM

## 2020-09-24 NOTE — Progress Notes (Signed)
Cardiology Office Note   Date:  09/28/2020   ID:  Kyle Baldwin, Kyle Baldwin [birth year], MRN [id number]  PCP:  [name], MD  Cardiologist:   [name], MD   Chief Complaint  Patient presents with  . Coronary Artery Disease      History of Present Illness: Kyle Baldwin is a 75 y.o. male who is seen for follow up  of CAD s/p CABG in 2007 by Dr. Servando Snare. This included a sequential SVG to the PDA and PLOM and LIMA to the LAD.  He also has a history of HTN and Hyperlipidemia.   He was seen in the ED on 12//31/19 with an episode of SOB. States he awoke suddenly from sleep and couldn't breathe. Felt like his throat was closing up. Lasted about 5 minutes. No tongue swelling.  Symptoms resolved. CXR and Ecg were OK. Labs remarkable for creatinine 1.28.   He was seen in the ED in August 2020 with vertigo. Evaluation was negative.   He was diagnosed with COVID 19 in September 2021. Received monoclonal antibodies. Subsequently complained of palpitations. Event monitor placed. Event monitor showed rare PACs and PVCs. <1%. 4 brief runs of SVT longest 6 beats. Now notes only residual from Covid is altered taste. He denies any chest pain or dyspnea. Stays.     Past Medical History:  Diagnosis Date  . Bilateral carpal tunnel syndrome   . 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 CAD (coronary artery disease)   . Diverticulosis   . Fatty liver   . GERD (gastroesophageal reflux disease)   . Hyperlipidemia   . Hypertension   . Kidney stones   . Liver cyst right   hepatic lobe cyst  . Myocardial infarction [facility]) 2007   heart bypass 2007  . Vitamin B 12 deficiency     Past Surgical History:  Procedure Laterality Date  . BLADDER SURGERY  2005  . heart bypass  2007   3 vessels  . LUMBAR LAMINECTOMY       Current Outpatient Medications  Medication Sig Dispense Refill  . amLODipine (NORVASC) 5 MG tablet Take 5 mg by mouth daily.    [name] aspirin EC 81 MG tablet  Take 81 mg by mouth daily.    . cyanocobalamin 1000 MCG tablet Take 1,000 mcg by mouth daily.     [name] losartan (COZAAR) 100 MG tablet Take 100 mg by mouth daily.     . metoprolol tartrate (LOPRESSOR) 25 MG tablet Take 25 mg by mouth daily.     . nitroGLYCERIN (NITROSTAT) 0.4 MG SL tablet Place 0.4 mg under the tongue every 5 (five) minutes as needed for chest pain.    [name] omeprazole (PRILOSEC) 20 MG capsule Take 20 mg by mouth daily.    . simvastatin (ZOCOR) 20 MG tablet Take 20 mg by mouth daily.    . vitamin C (ASCORBIC ACID) 500 MG tablet Take 1,000 mg by mouth daily.     No current facility-administered medications for this visit.    Allergies:   Codeine and Morphine and related    Social History:  The patient  reports that he quit smoking about 52 years ago. His smoking use included cigarettes. He has never used smokeless tobacco. He reports that he does not drink alcohol and does not use drugs.   Family History:  The patient's family history includes Colon cancer (age of onset: 46) in his maternal grandfather; Diabetes in his  maternal grandmother; Heart disease in his maternal grandmother.    ROS:  Please see the history of present illness.   Otherwise, review of systems are positive for none.   All other systems are reviewed and negative.    PHYSICAL EXAM: VS:  BP 130/78 (BP Location: Left Arm, Patient Position: Sitting, Cuff Size: Large)   Pulse 70   Ht 6' (1.829 m)   Wt 245 lb (111.1 kg)   BMI 33.23 kg/m  , BMI Body mass index is 33.23 kg/m. GENERAL:  Well appearing overweight WM in NAD. HEENT:  PERRL, EOMI, sclera are clear. Oropharynx is clear. NECK:  No jugular venous distention, carotid upstroke brisk and symmetric, no bruits, no thyromegaly or adenopathy LUNGS:  Clear to auscultation bilaterally CHEST:  Unremarkable HEART:  RRR,  PMI not displaced or sustained,S1 and S2 within normal limits, no S3, no S4: no clicks, no rubs, no murmurs ABD:  Soft, nontender. BS +, no  masses or bruits. No hepatomegaly, no splenomegaly EXT:  2 + pulses throughout, no edema, no cyanosis no clubbing SKIN:  Warm and dry.  No rashes NEURO:  Alert and oriented x 3. Cranial nerves II through XII intact. PSYCH:  Cognitively intact    EKG:  EKG is not ordered today.    Recent Labs: No results found for requested labs within last 8760 hours.    Lipid Panel No results found for: CHOL, TRIG, HDL, CHOLHDL, VLDL, LDLCALC, LDLDIRECT    Wt Readings from Last 3 Encounters:  09/28/20 245 lb (111.1 kg)  03/19/20 239 lb (108.4 kg)  07/08/19 246 lb 6.4 oz (111.8 kg)     Labs dated 02/05/16: cholesterol 126, triglycerides 91, LDL 74, HDL 34. A1c 5.2%. CMET, TSH, CBC normal Dated 02/09/17: cholesterol 130, triglycerides 111, HDL 33, LDL 75. A1c 5.2%. Creatinine 1.2. CBC and ALT normal. Dated 03/09/18: cholesterol 116, triglycerides 86, HDL 27. LDL 72. ALT normal. A1c 5.3%.  Dated 05/29/18: creatinine 1.28. CBC normal.  Dated 03/15/19: cholesterol 136, triglycerides 100, HDL 34, LDL 82. A1c 5.3%. creatinine 1.2. Otherwise CMET and CBC normal Dated 03/20/20: cholesterol 111, triglycerides 82., HDL 34, LDL 61. A1c 5.2%. BUN 20, creatinine 1.2. otherwise CMET and CBC normal.  Other studies Reviewed: Additional studies/ records that were reviewed today include: Myoview 06/23/15: Review of the above records demonstrates: Study Highlights    Nuclear stress EF: 51%.  The left ventricular ejection fraction is mildly decreased (45-54%).  There was no ST segment deviation noted during stress.  No T wave inversion was noted during stress.  Defect 1: There is a small defect of mild severity present in the basal inferior location.  This is a low risk study.   Low risk stress nuclear study with a small fixed basal inferior fixed defect (small scar versus diaphragmatic attenuation artifact). No reversible ischemia is seen. Borderline left ventricular global systolic function   Event  monitor. 03/19/20:Study Highlights   Normal sinus rhythm  Rare PACs and PVCs  Few brief runs of SVT less than 6 beats.      ASSESSMENT AND PLAN:  1.  CAD s/p CABG 2007. Myoview Jan 2017 was low risk. He is asymptomatic. Will continue with medical therapy.  2. HTN controlled.  3. Hyperlipidemia.  on Zocor. At goal.    4. Palpitations.  event monitor with really minimal ectopy. Will continue current dose of metoprolol.   Disposition:   FU with me in one year  Signed, Shante Maysonet Martinique, MD  09/28/2020 8:25 AM  [facility] [address], [place], [place], [address] Phone [phone], Fax [phone]

## 2020-09-28 ENCOUNTER — Other Ambulatory Visit: Payer: Self-pay

## 2020-09-28 ENCOUNTER — Encounter: Payer: Self-pay | Admitting: Cardiology

## 2020-09-28 ENCOUNTER — Ambulatory Visit: Payer: Medicare Other | Admitting: Cardiology

## 2020-09-28 VITALS — BP 130/78 | HR 70 | Ht 72.0 in | Wt 245.0 lb

## 2020-09-28 DIAGNOSIS — E7800 Pure hypercholesterolemia, unspecified: Secondary | ICD-10-CM

## 2020-09-28 DIAGNOSIS — I25810 Atherosclerosis of coronary artery bypass graft(s) without angina pectoris: Secondary | ICD-10-CM

## 2020-09-28 DIAGNOSIS — I10 Essential (primary) hypertension: Secondary | ICD-10-CM

## 2021-03-15 ENCOUNTER — Ambulatory Visit (HOSPITAL_COMMUNITY)
Admission: RE | Admit: 2021-03-15 | Discharge: 2021-03-15 | Disposition: A | Discharge status: Home / Self Care | Payer: Medicare Other | Source: Ambulatory Visit | Attending: Cardiology | Admitting: Cardiology

## 2021-03-15 ENCOUNTER — Other Ambulatory Visit: Payer: Self-pay

## 2021-03-15 ENCOUNTER — Encounter (HOSPITAL_COMMUNITY): Payer: Medicare Other

## 2021-03-15 ENCOUNTER — Other Ambulatory Visit (HOSPITAL_COMMUNITY): Payer: Self-pay | Admitting: Internal Medicine

## 2021-03-15 DIAGNOSIS — M79669 Pain in unspecified lower leg: Secondary | ICD-10-CM

## 2021-03-15 DIAGNOSIS — M7989 Other specified soft tissue disorders: Secondary | ICD-10-CM

## 2021-07-07 ENCOUNTER — Telehealth: Payer: Self-pay | Admitting: Cardiology

## 2021-07-07 NOTE — Telephone Encounter (Signed)
Pt c/o swelling: STAT is pt has developed SOB within 24 hours  How much weight have you gained and in what time span?  No weight gain   If swelling, where is the swelling located?  Feet and ankles   Are you currently taking a fluid pill?  No, patient wearing compression hose which have helps a lot   Are you currently SOB?  No   Do you have a log of your daily weights (if so, list)?  No log available   Have you gained 3 pounds in a day or 5 pounds in a week?  No  Have you traveled recently?   No and patient states his feet are often elevated at night    Patient c/o Palpitations:  High priority if patient c/o lightheadedness, shortness of breath, or chest pain  How long have you had palpitations/irregular HR/ Afib? Are you having the symptoms now?  Patient has he has always had palpitations, but over the past few weeks they have become more frequent. He states he is currently having palpitations  Are you currently experiencing lightheadedness, SOB or CP?  No   Do you have a history of afib (atrial fibrillation) or irregular heart rhythm?  Yes    Have you checked your BP or HR? (document readings if available):  Patient states his readings have been normal. He doesn't have a log, but his BP has been in the 120/70's and HR is ranging 63-70  Are you experiencing any other symptoms?  No

## 2021-07-07 NOTE — Telephone Encounter (Signed)
Returned call to patient left message on personal voice mail to call back. 

## 2021-07-07 NOTE — Telephone Encounter (Signed)
Received a call back from patient.He stated he has been having swelling in both feet and ankles.No sob.Stated he is having frequent palpitations.When he has palpitations he notices chest heaviness.Appointment scheduled with Dr.Jordan 2/16 at 9:30 am.

## 2021-07-12 NOTE — Progress Notes (Signed)
Cardiology Office Note   Date:  07/15/2021   ID:  Kyle, Baldwin March 19, 1946, MRN [id number]  PCP:  [name], MD  Cardiologist:   Nohelani Benning Martinique, MD   Chief Complaint  Patient presents with   Coronary Artery Disease   Chest Pain   Palpitations      History of Present Illness: Kyle Baldwin is a 76 y.o. male who is seen for evaluation of leg swelling. He has a history of CAD s/p CABG in 2007 by Dr. Servando Snare. This included a sequential SVG to the PDA and PLOM and LIMA to the LAD.  He also has a history of HTN and Hyperlipidemia.   He was seen in the ED on 12//31/19 with an episode of SOB. States he awoke suddenly from sleep and couldn't breathe. Felt like his throat was closing up. Lasted about 5 minutes. No tongue swelling.  Symptoms resolved. CXR and Ecg were OK. Labs remarkable for creatinine 1.28.   He was seen in the ED in August 2020 with vertigo. Evaluation was negative.   He was diagnosed with COVID 19 in September 2021. Received monoclonal antibodies. Subsequently complained of palpitations. Event monitor placed. Event monitor showed rare PACs and PVCs. <1%. 4 brief runs of SVT longest 6 beats. No therapy recommended.   On follow up today he notes that since the summer he has noted more palpitations. Notes HR is faster and he has more forceful beats. He feels lightheaded with this and has chest pressure. Notes more ankle swelling L>R. He also notes chest pressure is worse after eating.     Past Medical History:  Diagnosis Date   Bilateral carpal tunnel syndrome    Bladder cancer ([place]) 2005   bladder cancer in situ 2005   CAD (coronary artery disease)    Diverticulosis    Fatty liver    GERD (gastroesophageal reflux disease)    Hyperlipidemia    Hypertension    Kidney stones    Liver cyst right   hepatic lobe cyst   Myocardial infarction [facility]) 2007   heart bypass 2007   Vitamin B 12 deficiency     Past Surgical History:  Procedure Laterality  Date   BLADDER SURGERY  2005   heart bypass  2007   3 vessels   LUMBAR LAMINECTOMY       Current Outpatient Medications  Medication Sig Dispense Refill   aspirin EC 81 MG tablet Take 81 mg by mouth daily.     cyanocobalamin 1000 MCG tablet Take 1,000 mcg by mouth daily.      losartan (COZAAR) 100 MG tablet Take 100 mg by mouth daily.      metoprolol tartrate (LOPRESSOR) 50 MG tablet Take 1 tablet (50 mg total) by mouth 2 (two) times daily. 180 tablet 3   nitroGLYCERIN (NITROSTAT) 0.4 MG SL tablet Place 0.4 mg under the tongue every 5 (five) minutes as needed for chest pain.     omeprazole (PRILOSEC) 20 MG capsule Take 20 mg by mouth daily.     simvastatin (ZOCOR) 20 MG tablet Take 20 mg by mouth daily.     Vitamin D, Ergocalciferol, (DRISDOL) 1.25 MG (50000 UNIT) CAPS capsule Take 50,000 Units by mouth once a week.     No current facility-administered medications for this visit.    Allergies:   Codeine and Morphine and related    Social History:  The patient  reports that he quit smoking about 53 years ago. His smoking  use included cigarettes. He has never used smokeless tobacco. He reports that he does not drink alcohol and does not use drugs.   Family History:  The patient's family history includes Colon cancer (age of onset: 93) in his maternal grandfather; Diabetes in his maternal grandmother; Heart disease in his maternal grandmother.    ROS:  Please see the history of present illness.   Otherwise, review of systems are positive for none.   All other systems are reviewed and negative.    PHYSICAL EXAM: VS:  BP 119/81 (BP Location: Left Arm, Patient Position: Sitting, Cuff Size: Large)    Pulse (!) 103    Ht 6' (1.829 m)    Wt 241 lb 12.8 oz (109.7 kg)    SpO2 97%    BMI 32.79 kg/m  , BMI Body mass index is 32.79 kg/m. GENERAL:  Well appearing overweight WM in NAD. HEENT:  PERRL, EOMI, sclera are clear. Oropharynx is clear. NECK:  No jugular venous distention, carotid  upstroke brisk and symmetric, no bruits, no thyromegaly or adenopathy LUNGS:  Clear to auscultation bilaterally CHEST:  Unremarkable HEART:  RRR,  PMI not displaced or sustained,S1 and S2 within normal limits, no S3, no S4: no clicks, no rubs, no murmurs ABD:  Soft, nontender. BS +, no masses or bruits. No hepatomegaly, no splenomegaly EXT:  2 + pulses throughout, no edema, no cyanosis no clubbing SKIN:  Warm and dry.  No rashes NEURO:  Alert and oriented x 3. Cranial nerves II through XII intact. PSYCH:  Cognitively intact    EKG:  EKG is  ordered today. Sinus tachy with PACs. 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 Rate 103. I have personally reviewed and interpreted this study.     Recent Labs: No results found for requested labs within last 8760 hours.    Lipid Panel No results found for: CHOL, TRIG, HDL, CHOLHDL, VLDL, LDLCALC, LDLDIRECT    Wt Readings from Last 3 Encounters:  07/15/21 241 lb 12.8 oz (109.7 kg)  09/28/20 245 lb (111.1 kg)  03/19/20 239 lb (108.4 kg)     Labs dated 02/05/16: cholesterol 126, triglycerides 91, LDL 74, HDL 34. A1c 5.2%. CMET, TSH, CBC normal Dated 02/09/17: cholesterol 130, triglycerides 111, HDL 33, LDL 75. A1c 5.2%. Creatinine 1.2. CBC and ALT normal. Dated 03/09/18: cholesterol 116, triglycerides 86, HDL 27. LDL 72. ALT normal. A1c 5.3%.  Dated 05/29/18: creatinine 1.28. CBC normal.  Dated 03/15/19: cholesterol 136, triglycerides 100, HDL 34, LDL 82. A1c 5.3%. creatinine 1.2. Otherwise CMET and CBC normal Dated 03/20/20: cholesterol 111, triglycerides 82., HDL 34, LDL 61. A1c 5.2%. BUN 20, creatinine 1.2. otherwise CMET and CBC normal. Dated 04/05/21: cholesterol 119, triglycerides 70, HDL 37, LDL 68. A1c 5.4%. potassium normal. ALT normal.  Other studies Reviewed: Additional studies/ records that were reviewed today include: Myoview 06/23/15: Review of the above records demonstrates: Study Highlights   Nuclear stress EF: 51%. The left ventricular  ejection fraction is mildly decreased (45-54%). There was no ST segment deviation noted during stress. No T wave inversion was noted during stress. Defect 1: There is a small defect of mild severity present in the basal inferior location. This is a low risk study.   Low risk stress nuclear study with a small fixed basal inferior fixed defect (small scar versus diaphragmatic attenuation artifact). No reversible ischemia is seen. Borderline left ventricular global systolic function   Event monitor. 03/19/20:Study Highlights  Normal sinus rhythm Rare PACs and PVCs Few brief runs of SVT  less than 6 beats.      ASSESSMENT AND PLAN:  1.  CAD s/p CABG 2007. Myoview Jan 2017 was low risk. He is is experiencing symptom of chest pressure with tachycardia and post meals. Possible angina equivalent. Will increase metoprolol to 50 mg bid. Check Echo. May need to consider cardiac cath since he is 15 years out from CABG.   2. HTN controlled.  3. Hyperlipidemia.  on Zocor. At goal.    4. Palpitations/tachycardia.   HR is clearly faster than baseline. Mean HR 73 in 2021 now 103 at rest. Will check CBC and TFTs. Increase metoprolol. Stop amlodipine to allow increase in metoprolol dose and since this could be contributing to swelling. I will follow up after Echo.   Disposition:   FU with me after Echo done.   Signed, Jassiah Viviano Martinique, MD  07/15/2021 9:47 AM    [facility] [address] [place], [place], [address] Phone [phone], Fax [phone]

## 2021-07-13 ENCOUNTER — Ambulatory Visit: Payer: Medicare Other | Admitting: Cardiology

## 2021-07-15 ENCOUNTER — Other Ambulatory Visit: Payer: Self-pay

## 2021-07-15 ENCOUNTER — Ambulatory Visit: Payer: Medicare Other | Admitting: Cardiology

## 2021-07-15 VITALS — BP 119/81 | HR 103 | Ht 72.0 in | Wt 241.8 lb

## 2021-07-15 DIAGNOSIS — M79669 Pain in unspecified lower leg: Secondary | ICD-10-CM

## 2021-07-15 DIAGNOSIS — R002 Palpitations: Secondary | ICD-10-CM

## 2021-07-15 DIAGNOSIS — I10 Essential (primary) hypertension: Secondary | ICD-10-CM

## 2021-07-15 DIAGNOSIS — I25810 Atherosclerosis of coronary artery bypass graft(s) without angina pectoris: Secondary | ICD-10-CM | POA: Diagnosis not present

## 2021-07-15 DIAGNOSIS — M7989 Other specified soft tissue disorders: Secondary | ICD-10-CM

## 2021-07-15 LAB — CBC WITH DIFFERENTIAL/PLATELET
Basophils Absolute: 0.1 10*3/uL (ref 0.0–0.2)
Basos: 1 %
EOS (ABSOLUTE): 0.2 10*3/uL (ref 0.0–0.4)
Eos: 4 %
Hematocrit: 41.3 % (ref 37.5–51.0)
Hemoglobin: 14.3 g/dL (ref 13.0–17.7)
Immature Grans (Abs): 0 10*3/uL (ref 0.0–0.1)
Immature Granulocytes: 0 %
Lymphocytes Absolute: 1 10*3/uL (ref 0.7–3.1)
Lymphs: 18 %
MCH: 31 pg (ref 26.6–33.0)
MCHC: 34.6 g/dL (ref 31.5–35.7)
MCV: 89 fL (ref 79–97)
Monocytes Absolute: 0.5 10*3/uL (ref 0.1–0.9)
Monocytes: 9 %
Neutrophils Absolute: 3.7 10*3/uL (ref 1.4–7.0)
Neutrophils: 68 %
Platelets: 178 10*3/uL (ref 150–450)
RBC: 4.62 x10E6/uL (ref 4.14–5.80)
RDW: 13.7 % (ref 11.6–15.4)
WBC: 5.4 10*3/uL (ref 3.4–10.8)

## 2021-07-15 LAB — BASIC METABOLIC PANEL
BUN/Creatinine Ratio: 18 (ref 10–24)
BUN: 21 mg/dL (ref 8–27)
CO2: 23 mmol/L (ref 20–29)
Calcium: 9.2 mg/dL (ref 8.6–10.2)
Chloride: 106 mmol/L (ref 96–106)
Creatinine, Ser: 1.15 mg/dL (ref 0.76–1.27)
Glucose: 139 mg/dL — ABNORMAL HIGH (ref 70–99)
Potassium: 4.2 mmol/L (ref 3.5–5.2)
Sodium: 141 mmol/L (ref 134–144)
eGFR: 67 mL/min/{1.73_m2} (ref >59)

## 2021-07-15 LAB — TSH: TSH: 1.3 u[IU]/mL (ref 0.450–4.500)

## 2021-07-15 LAB — T4, FREE: Free T4: 1.26 ng/dL (ref 0.82–1.77)

## 2021-07-15 MED ORDER — METOPROLOL TARTRATE 50 MG PO TABS: 50.0000 mg | ORAL_TABLET | Freq: Two times a day (BID) | ORAL | 3 refills | Status: AC

## 2021-07-15 NOTE — Patient Instructions (Addendum)
Stop amlodipine  Increase metoprolol to 50 mg twice a day  We will check some blood work today and arrange for an Echocardiogram.   Schedule follow up appointment after Echo

## 2021-07-23 NOTE — Progress Notes (Signed)
?  ?Cardiology Office Note ? ? ?Date:  07/29/2021  ? ?ID:  Kyle Baldwin, DOB 12/07/1945, MRN [id number] ? ?PCP:  Kyle Lopes, MD  ?Cardiologist:   Kyle Reza Martin[place], MD  ? ?Chief Complaint  ?Patient presents with  ? Coronary Artery Disease  ? ? ?  ?History of Present Illness: ?Kyle Baldwin is a 76 y.o. male who is seen for evaluation of leg swelling. He has a history of CAD s/p CABG in 2007 by Dr. Servando Snare. This included a sequential SVG to the PDA and PLOM and LIMA to the LAD.  He also has a history of HTN and Hyperlipidemia.  ? ?He was seen in the ED on 12//31/19 with an episode of SOB. States he awoke suddenly from sleep and couldn't breathe. Felt like his throat was closing up. Lasted about 5 minutes. No tongue swelling.  Symptoms resolved. CXR and Ecg were OK. Labs remarkable for creatinine 1.28.  ? ?He was seen in the ED in August 2020 with vertigo. Evaluation was negative.  ? ?He was diagnosed with COVID 19 in September 2021. Received monoclonal antibodies. Subsequently complained of palpitations. Event monitor placed. Event monitor showed rare PACs and PVCs. <1%. 4 brief runs of SVT longest 6 beats. No therapy recommended.  ? ?When seen recently he noted that since the summer he has noted more palpitations. Notes HR is faster and he has more forceful beats. He feels lightheaded with this and has chest pressure. Notes more ankle swelling L>R. He also notes chest pressure is worse after eating. We stopped his amlodipine and increased his metoprolol dose. Echo was ordered and showed a proximal aortic aneurysm at 4.4 cm otherwise normal. On follow up today  he notes significant improvement in his palpitations with the increased metoprolol but is still experiencing the chest pressure.  ? ? ? ?Past Medical History:  ?Diagnosis Date  ? Bilateral carpal tunnel syndrome   ? Bladder cancer ([place]) 2005  ? bladder cancer in situ 2005  ? CAD (coronary artery disease)   ? Diverticulosis   ? Fatty liver   ? GERD  (gastroesophageal reflux disease)   ? Hyperlipidemia   ? Hypertension   ? Kidney stones   ? Liver cyst right  ? hepatic lobe cyst  ? Myocardial infarction [facility]) 2007  ? heart bypass 2007  ? Vitamin B 12 deficiency   ? ? ?Past Surgical History:  ?Procedure Laterality Date  ? BLADDER SURGERY  2005  ? heart bypass  2007  ? 3 vessels  ? LUMBAR LAMINECTOMY    ? ? ? ?Current Outpatient Medications  ?Medication Sig Dispense Refill  ? aspirin EC 81 MG tablet Take 81 mg by mouth daily.    ? cyanocobalamin 1000 MCG tablet Take 1,000 mcg by mouth daily.     ? losartan (COZAAR) 100 MG tablet Take 100 mg by mouth daily.     ? metoprolol tartrate (LOPRESSOR) 50 MG tablet Take 1 tablet (50 mg total) by mouth 2 (two) times daily. 180 tablet 3  ? nitroGLYCERIN (NITROSTAT) 0.4 MG SL tablet Place 0.4 mg under the tongue every 5 (five) minutes as needed for chest pain.    ? omeprazole (PRILOSEC) 20 MG capsule Take 20 mg by mouth daily.    ? simvastatin (ZOCOR) 20 MG tablet Take 20 mg by mouth daily.    ? Vitamin D, Ergocalciferol, (DRISDOL) 1.25 MG (50000 UNIT) CAPS capsule Take 50,000 Units by mouth once a week.    ? ?  No current facility-administered medications for this visit.  ? ? ?Allergies:   Codeine and Morphine and related  ? ? ?Social History:  The patient  reports that he quit smoking about 53 years ago. His smoking use included cigarettes. He has never used smokeless tobacco. He reports that he does not drink alcohol and does not use drugs.  ? ?Family History:  The patient's family history includes Colon cancer (age of onset: 42) in his maternal grandfather; Diabetes in his maternal grandmother; Heart disease in his maternal grandmother.  ? ? ?ROS:  Please see the history of present illness.   Otherwise, review of systems are positive for none.   All other systems are reviewed and negative.  ? ? ?PHYSICAL EXAM: ?VS:  BP 120/76 (BP Location: Left Arm, Patient Position: Sitting, Cuff Size: Normal)   Pulse 76   Resp 20   Ht  6' (1.829 m)   Wt 238 lb 9.6 oz (108.2 kg)   SpO2 92%   BMI 32.36 kg/m?  , BMI Body mass index is 32.36 kg/m?. ?GENERAL:  Well appearing overweight WM in NAD. ?HEENT:  PERRL, EOMI, sclera are clear. Oropharynx is clear. ?NECK:  No jugular venous distention, carotid upstroke brisk and symmetric, no bruits, no thyromegaly or adenopathy ?LUNGS:  Clear to auscultation bilaterally ?CHEST:  Unremarkable ?HEART:  RRR,  PMI not displaced or sustained,S1 and S2 within normal limits, no S3, no S4: no clicks, no rubs, no murmurs ?ABD:  Soft, nontender. BS +, no masses or bruits. No hepatomegaly, no splenomegaly ?EXT:  2 + pulses throughout, no edema, no cyanosis no clubbing ?SKIN:  Warm and dry.  No rashes ?NEURO:  Alert and oriented x 3. Cranial nerves II through XII intact. ?PSYCH:  Cognitively intact ? ? ? ?EKG:  EKG is  not ordered today.  ? ? ? ? ?Recent Labs: ?07/15/2021: BUN 21; Creatinine, Ser 1.15; Hemoglobin 14.3; Platelets 178; Potassium 4.2; Sodium 141; TSH 1.300  ? ? ?Lipid Panel ?No results found for: CHOL, TRIG, HDL, CHOLHDL, VLDL, LDLCALC, LDLDIRECT ?  ? ?Wt Readings from Last 3 Encounters:  ?07/29/21 238 lb 9.6 oz (108.2 kg)  ?07/15/21 241 lb 12.8 oz (109.7 kg)  ?09/28/20 245 lb (111.1 kg)  ?  ? ?Labs dated 02/05/16: cholesterol 126, triglycerides 91, LDL 74, HDL 34. A1c 5.2%. CMET, TSH, CBC normal ?Dated 02/09/17: cholesterol 130, triglycerides 111, HDL 33, LDL 75. A1c 5.2%. Creatinine 1.2. CBC and ALT normal. ?Dated 03/09/18: cholesterol 116, triglycerides 86, HDL 27. LDL 72. ALT normal. A1c 5.3%.  ?Dated 05/29/18: creatinine 1.28. CBC normal.  ?Dated 03/15/19: cholesterol 136, triglycerides 100, HDL 34, LDL 82. A1c 5.3%. creatinine 1.2. Otherwise CMET and CBC normal ?Dated 03/20/20: cholesterol 111, triglycerides 82., HDL 34, LDL 61. A1c 5.2%. BUN 20, creatinine 1.2. otherwise CMET and CBC normal. ?Dated 04/05/21: cholesterol 119, triglycerides 70, HDL 37, LDL 68. A1c 5.4%. potassium normal. ALT  normal. ? ?Other studies Reviewed: ?Additional studies/ records that were reviewed today include: Myoview 06/23/15: ?Review of the above records demonstrates: Study Highlights  ? ?Nuclear stress EF: 51%. ?The left ventricular ejection fraction is mildly decreased (45-54%). ?There was no ST segment deviation noted during stress. ?No T wave inversion was noted during stress. ?Defect 1: There is a small defect of mild severity present in the basal inferior location. ?This is a low risk study. ?  ?Low risk stress nuclear study with a small fixed basal inferior fixed defect (small scar versus diaphragmatic attenuation artifact). No reversible  ischemia is seen. ?Borderline left ventricular global systolic function  ? ?Event monitor. 03/19/20:Study Highlights ? ?Normal sinus rhythm ?Rare PACs and PVCs ?Few brief runs of SVT less than 6 beats. ? ?  ? ? ?ASSESSMENT AND PLAN: ? ?1.  CAD s/p CABG 2007. Myoview Jan 2017 was low risk. He is is experiencing symptom of chest pressure with tachycardia and post meals. I am concerned this is an angina equivalent. LV function normal  on Echo. Symptoms have persisted despite increase in metoprolol.  I have recommended cardiac cath given his symptoms and the fact he is 15 years out from CABG. The procedure and risks were reviewed including but not limited to death, myocardial infarction, stroke, arrythmias, bleeding, transfusion, emergency surgery, dye allergy, or renal dysfunction. The patient voices understanding and is agreeable to proceed. ? ? ?2. HTN controlled. ? ?3. Hyperlipidemia.  on Zocor. At goal.   ? ?4. Palpitations/tachycardia.   Symptoms improved with increase in metoprolol. TSH and CBC normal ? ?5. Ascending aortic aneurysm. Will need yearly follow up imaging. Instructed to avoid heavy lifting/straining. Should avoid quinolone antibiotics.  ? ?Disposition:   Cardiac cath on March 16.  ? ?Signed, ?Avia Merkley Martin[place], MD  ?07/29/2021 8:41 AM    ?[facility] ?[address] [place], [place], [address] ?Phone [phone], Fax [phone] ?

## 2021-07-23 NOTE — H&P (View-Only) (Signed)
?  ?Cardiology Office Note ? ? ?Date:  07/29/2021  ? ?ID:  Kyle Baldwin, DOB [birth year], MRN [id number] ? ?PCP:  [name], MD  ?Cardiologist:   Jenisa Monty Martinique, MD  ? ?Chief Complaint  ?Patient presents with  ? Coronary Artery Disease  ? ? ?  ?History of Present Illness: ?Kyle Baldwin is a 76 y.o. male who is seen for evaluation of leg swelling. He has a history of CAD s/p CABG in 2007 by Dr. Servando Snare. This included a sequential SVG to the PDA and PLOM and LIMA to the LAD.  He also has a history of HTN and Hyperlipidemia.  ? ?He was seen in the ED on 12//31/19 with an episode of SOB. States he awoke suddenly from sleep and couldn't breathe. Felt like his throat was closing up. Lasted about 5 minutes. No tongue swelling.  Symptoms resolved. CXR and Ecg were OK. Labs remarkable for creatinine 1.28.  ? ?He was seen in the ED in August 2020 with vertigo. Evaluation was negative.  ? ?He was diagnosed with COVID 19 in September 2021. Received monoclonal antibodies. Subsequently complained of palpitations. Event monitor placed. Event monitor showed rare PACs and PVCs. <1%. 4 brief runs of SVT longest 6 beats. No therapy recommended.  ? ?When seen recently he noted that since the summer he has noted more palpitations. Notes HR is faster and he has more forceful beats. He feels lightheaded with this and has chest pressure. Notes more ankle swelling L>R. He also notes chest pressure is worse after eating. We stopped his amlodipine and increased his metoprolol dose. Echo was ordered and showed a proximal aortic aneurysm at 4.4 cm otherwise normal. On follow up today  he notes significant improvement in his palpitations with the increased metoprolol but is still experiencing the chest pressure.  ? ? ? ?Past Medical History:  ?Diagnosis Date  ? Bilateral carpal tunnel syndrome   ? Bladder cancer ([place]) 2005  ? bladder cancer in situ 2005  ? CAD (coronary artery disease)   ? Diverticulosis   ? Fatty liver   ? GERD  (gastroesophageal reflux disease)   ? Hyperlipidemia   ? Hypertension   ? Kidney stones   ? Liver cyst right  ? hepatic lobe cyst  ? Myocardial infarction [facility]) 2007  ? heart bypass 2007  ? Vitamin B 12 deficiency   ? ? ?Past Surgical History:  ?Procedure Laterality Date  ? BLADDER SURGERY  2005  ? heart bypass  2007  ? 3 vessels  ? LUMBAR LAMINECTOMY    ? ? ? ?Current Outpatient Medications  ?Medication Sig Dispense Refill  ? aspirin EC 81 MG tablet Take 81 mg by mouth daily.    ? cyanocobalamin 1000 MCG tablet Take 1,000 mcg by mouth daily.     ? losartan (COZAAR) 100 MG tablet Take 100 mg by mouth daily.     ? metoprolol tartrate (LOPRESSOR) 50 MG tablet Take 1 tablet (50 mg total) by mouth 2 (two) times daily. 180 tablet 3  ? nitroGLYCERIN (NITROSTAT) 0.4 MG SL tablet Place 0.4 mg under the tongue every 5 (five) minutes as needed for chest pain.    ? omeprazole (PRILOSEC) 20 MG capsule Take 20 mg by mouth daily.    ? simvastatin (ZOCOR) 20 MG tablet Take 20 mg by mouth daily.    ? Vitamin D, Ergocalciferol, (DRISDOL) 1.25 MG (50000 UNIT) CAPS capsule Take 50,000 Units by mouth once a week.    ? ?  No current facility-administered medications for this visit.  ? ? ?Allergies:   Codeine and Morphine and related  ? ? ?Social History:  The patient  reports that he quit smoking about 53 years ago. His smoking use included cigarettes. He has never used smokeless tobacco. He reports that he does not drink alcohol and does not use drugs.  ? ?Family History:  The patient's family history includes Colon cancer (age of onset: 61) in his maternal grandfather; Diabetes in his maternal grandmother; Heart disease in his maternal grandmother.  ? ? ?ROS:  Please see the history of present illness.   Otherwise, review of systems are positive for none.   All other systems are reviewed and negative.  ? ? ?PHYSICAL EXAM: ?VS:  BP 120/76 (BP Location: Left Arm, Patient Position: Sitting, Cuff Size: Normal)   Pulse 76   Resp 20   Ht  6' (1.829 m)   Wt 238 lb 9.6 oz (108.2 kg)   SpO2 92%   BMI 32.36 kg/m?  , BMI Body mass index is 32.36 kg/m?. ?GENERAL:  Well appearing overweight WM in NAD. ?HEENT:  PERRL, EOMI, sclera are clear. Oropharynx is clear. ?NECK:  No jugular venous distention, carotid upstroke brisk and symmetric, no bruits, no thyromegaly or adenopathy ?LUNGS:  Clear to auscultation bilaterally ?CHEST:  Unremarkable ?HEART:  RRR,  PMI not displaced or sustained,S1 and S2 within normal limits, no S3, no S4: no clicks, no rubs, no murmurs ?ABD:  Soft, nontender. BS +, no masses or bruits. No hepatomegaly, no splenomegaly ?EXT:  2 + pulses throughout, no edema, no cyanosis no clubbing ?SKIN:  Warm and dry.  No rashes ?NEURO:  Alert and oriented x 3. Cranial nerves II through XII intact. ?PSYCH:  Cognitively intact ? ? ? ?EKG:  EKG is  not ordered today.  ? ? ? ? ?Recent Labs: ?07/15/2021: BUN 21; Creatinine, Ser 1.15; Hemoglobin 14.3; Platelets 178; Potassium 4.2; Sodium 141; TSH 1.300  ? ? ?Lipid Panel ?No results found for: CHOL, TRIG, HDL, CHOLHDL, VLDL, LDLCALC, LDLDIRECT ?  ? ?Wt Readings from Last 3 Encounters:  ?07/29/21 238 lb 9.6 oz (108.2 kg)  ?07/15/21 241 lb 12.8 oz (109.7 kg)  ?09/28/20 245 lb (111.1 kg)  ?  ? ?Labs dated 02/05/16: cholesterol 126, triglycerides 91, LDL 74, HDL 34. A1c 5.2%. CMET, TSH, CBC normal ?Dated 02/09/17: cholesterol 130, triglycerides 111, HDL 33, LDL 75. A1c 5.2%. Creatinine 1.2. CBC and ALT normal. ?Dated 03/09/18: cholesterol 116, triglycerides 86, HDL 27. LDL 72. ALT normal. A1c 5.3%.  ?Dated 05/29/18: creatinine 1.28. CBC normal.  ?Dated 03/15/19: cholesterol 136, triglycerides 100, HDL 34, LDL 82. A1c 5.3%. creatinine 1.2. Otherwise CMET and CBC normal ?Dated 03/20/20: cholesterol 111, triglycerides 82., HDL 34, LDL 61. A1c 5.2%. BUN 20, creatinine 1.2. otherwise CMET and CBC normal. ?Dated 04/05/21: cholesterol 119, triglycerides 70, HDL 37, LDL 68. A1c 5.4%. potassium normal. ALT  normal. ? ?Other studies Reviewed: ?Additional studies/ records that were reviewed today include: Myoview 06/23/15: ?Review of the above records demonstrates: Study Highlights  ? ?Nuclear stress EF: 51%. ?The left ventricular ejection fraction is mildly decreased (45-54%). ?There was no ST segment deviation noted during stress. ?No T wave inversion was noted during stress. ?Defect 1: There is a small defect of mild severity present in the basal inferior location. ?This is a low risk study. ?  ?Low risk stress nuclear study with a small fixed basal inferior fixed defect (small scar versus diaphragmatic attenuation artifact). No reversible  ischemia is seen. ?Borderline left ventricular global systolic function  ? ?Event monitor. 03/19/20:Study Highlights ? ?Normal sinus rhythm ?Rare PACs and PVCs ?Few brief runs of SVT less than 6 beats. ? ?  ? ? ?ASSESSMENT AND PLAN: ? ?1.  CAD s/p CABG 2007. Myoview Jan 2017 was low risk. He is is experiencing symptom of chest pressure with tachycardia and post meals. I am concerned this is an angina equivalent. LV function normal  on Echo. Symptoms have persisted despite increase in metoprolol.  I have recommended cardiac cath given his symptoms and the fact he is 15 years out from CABG. The procedure and risks were reviewed including but not limited to death, myocardial infarction, stroke, arrythmias, bleeding, transfusion, emergency surgery, dye allergy, or renal dysfunction. The patient voices understanding and is agreeable to proceed. ? ? ?2. HTN controlled. ? ?3. Hyperlipidemia.  on Zocor. At goal.   ? ?4. Palpitations/tachycardia.   Symptoms improved with increase in metoprolol. TSH and CBC normal ? ?5. Ascending aortic aneurysm. Will need yearly follow up imaging. Instructed to avoid heavy lifting/straining. Should avoid quinolone antibiotics.  ? ?Disposition:   Cardiac cath on March 16.  ? ?Signed, ?Kyce Ging Martinique, MD  ?07/29/2021 8:41 AM    ?[facility] ?[address], [place], [place], [address] ?Phone [phone], Fax [phone] ?

## 2021-07-27 ENCOUNTER — Other Ambulatory Visit: Payer: Self-pay

## 2021-07-27 ENCOUNTER — Ambulatory Visit (HOSPITAL_COMMUNITY): Payer: Medicare Other | Attending: Internal Medicine

## 2021-07-27 DIAGNOSIS — I10 Essential (primary) hypertension: Secondary | ICD-10-CM

## 2021-07-27 DIAGNOSIS — I25810 Atherosclerosis of coronary artery bypass graft(s) without angina pectoris: Secondary | ICD-10-CM

## 2021-07-27 DIAGNOSIS — M7989 Other specified soft tissue disorders: Secondary | ICD-10-CM | POA: Diagnosis present

## 2021-07-27 DIAGNOSIS — R002 Palpitations: Secondary | ICD-10-CM | POA: Insufficient documentation

## 2021-07-27 DIAGNOSIS — M79669 Pain in unspecified lower leg: Secondary | ICD-10-CM | POA: Diagnosis not present

## 2021-07-27 LAB — ECHOCARDIOGRAM COMPLETE
Area-P 1/2: 2.83 cm2
S' Lateral: 3.3 cm

## 2021-07-29 ENCOUNTER — Encounter: Payer: Self-pay | Admitting: Cardiology

## 2021-07-29 ENCOUNTER — Other Ambulatory Visit: Payer: Self-pay

## 2021-07-29 ENCOUNTER — Other Ambulatory Visit: Payer: Self-pay | Admitting: Cardiology

## 2021-07-29 ENCOUNTER — Ambulatory Visit: Payer: Medicare Other | Admitting: Cardiology

## 2021-07-29 VITALS — BP 120/76 | HR 76 | Resp 20 | Ht 72.0 in | Wt 238.6 lb

## 2021-07-29 DIAGNOSIS — Z01818 Encounter for other preprocedural examination: Secondary | ICD-10-CM

## 2021-07-29 DIAGNOSIS — I209 Angina pectoris, unspecified: Secondary | ICD-10-CM

## 2021-07-29 DIAGNOSIS — I7121 Aneurysm of the ascending aorta, without rupture: Secondary | ICD-10-CM

## 2021-07-29 DIAGNOSIS — I10 Essential (primary) hypertension: Secondary | ICD-10-CM | POA: Diagnosis not present

## 2021-07-29 DIAGNOSIS — E7800 Pure hypercholesterolemia, unspecified: Secondary | ICD-10-CM

## 2021-07-29 DIAGNOSIS — I25708 Atherosclerosis of coronary artery bypass graft(s), unspecified, with other forms of angina pectoris: Secondary | ICD-10-CM | POA: Diagnosis not present

## 2021-07-29 DIAGNOSIS — Z01812 Encounter for preprocedural laboratory examination: Secondary | ICD-10-CM

## 2021-07-29 MED ORDER — SODIUM CHLORIDE 0.9% FLUSH: 3.0000 mL | Freq: Two times a day (BID) | INTRAVENOUS | Status: DC

## 2021-07-29 NOTE — Patient Instructions (Signed)
Medication Instructions:  ?Continue same medications ?*If you need a refill on your cardiac medications before your next appointment, please call your pharmacy* ? ? ?Lab Work: ?Have done today bmet,cbc,pt ? ? ?Testing/Procedures: ?Cardiac Cath Thurs 08/12/21   Follow instructions below ? ? ?Follow-Up: ?At [facility], you and your health needs are our priority.  As part of our continuing mission to provide you with exceptional heart care, we have created designated Provider Care Teams.  These Care Teams include your primary Cardiologist (physician) and Advanced Practice Providers (APPs -  Physician Assistants and Nurse Practitioners) who all work together to provide you with the care you need, when you need it. ? ?We recommend signing up for the patient portal called "MyChart".  Sign up information is provided on this After Visit Summary.  MyChart is used to connect with patients for Virtual Visits (Telemedicine).  Patients are able to view lab/test results, encounter notes, upcoming appointments, etc.  Non-urgent messages can be sent to your provider as well.   ?To learn more about what you can do with MyChart, go to [email or web address].   ? ?Your next appointment:   ?  ? ?The format for your next appointment: Office  ? ? ?Provider:  Dr.Jordan ? ? ? ? ?[facility] ?[place] ?[address] ?[place] [address] ?Dept: [phone] ?Loc: [phone] ? ?NICCO REAUME  07/29/2021 ? ?You are scheduled for a Cardiac Catheterization on Thursday, March 16 with Dr. Peter Martinique. ? ?1. Please arrive at the Main Entrance A at [facility]: [place], [place] [address] at 5:30 AM (This time is two hours before your procedure to ensure your preparation). Free valet parking service is available.  ? ?Special note: Every effort is made to have your procedure done on time. Please understand that emergencies sometimes delay  scheduled procedures. ? ?2. Diet: Do not eat solid foods after midnight.  You may have clear liquids until 5 AM upon the day of the procedure. ? ?3. Labs: You will need to have blood drawn on Thurs 3/2 You do not need to be fasting. ? ?4. Medication instructions in preparation for your procedure: ? ? ? ? ? ? ? ? ? ?On the morning of your procedure, take Aspirin and any morning medicines NOT listed above.  You may use sips of water. ? ?5. Plan for same day discharge, you will only stay overnight if medically necessary.  ?6. You MUST have a responsible adult to drive you home. ?7. An adult MUST be with you the first 24 hours after you arrive home. ?8. Bring a current list of your medications, and the last time and date medication taken. ?9. Bring ID and current insurance cards. ?10.Please wear clothes that are easy to get on and off and wear slip-on shoes. ? ?Thank you for allowing [place] to care for you! ?  -- [facility] ? ? ? ?

## 2021-07-30 LAB — CBC WITH DIFFERENTIAL/PLATELET
Basophils Absolute: 0.1 10*3/uL (ref 0.0–0.2)
Basos: 1 %
EOS (ABSOLUTE): 0.2 10*3/uL (ref 0.0–0.4)
Eos: 4 %
Hematocrit: 42.2 % (ref 37.5–51.0)
Hemoglobin: 14.2 g/dL (ref 13.0–17.7)
Immature Grans (Abs): 0 10*3/uL (ref 0.0–0.1)
Immature Granulocytes: 0 %
Lymphocytes Absolute: 1 10*3/uL (ref 0.7–3.1)
Lymphs: 18 %
MCH: 30 pg (ref 26.6–33.0)
MCHC: 33.6 g/dL (ref 31.5–35.7)
MCV: 89 fL (ref 79–97)
Monocytes Absolute: 0.5 10*3/uL (ref 0.1–0.9)
Monocytes: 10 %
Neutrophils Absolute: 3.8 10*3/uL (ref 1.4–7.0)
Neutrophils: 67 %
Platelets: 181 10*3/uL (ref 150–450)
RBC: 4.73 x10E6/uL (ref 4.14–5.80)
RDW: 13.7 % (ref 11.6–15.4)
WBC: 5.7 10*3/uL (ref 3.4–10.8)

## 2021-07-30 LAB — PT AND PTT
INR: 1 (ref 0.9–1.2)
Prothrombin Time: 10.8 s (ref 9.1–12.0)
aPTT: 26 s (ref 24–33)

## 2021-07-30 LAB — BASIC METABOLIC PANEL
BUN/Creatinine Ratio: 18 (ref 10–24)
BUN: 22 mg/dL (ref 8–27)
CO2: 24 mmol/L (ref 20–29)
Calcium: 9.4 mg/dL (ref 8.6–10.2)
Chloride: 105 mmol/L (ref 96–106)
Creatinine, Ser: 1.2 mg/dL (ref 0.76–1.27)
Glucose: 106 mg/dL — ABNORMAL HIGH (ref 70–99)
Potassium: 4.4 mmol/L (ref 3.5–5.2)
Sodium: 141 mmol/L (ref 134–144)
eGFR: 63 mL/min/{1.73_m2} (ref >59)

## 2021-08-11 ENCOUNTER — Telehealth: Payer: Self-pay | Admitting: *Deleted

## 2021-08-11 NOTE — Telephone Encounter (Signed)
Cardiac Catheterization scheduled at [facility] for: Thursday August 12, 2021 7:30 AM ?Arrival time and place: [place] Entrance A at: 5:30 AM ? ? ?No solid food after midnight prior to cath, clear liquids until 5 AM day of procedure. ? ? ?Medication instructions: ?-Usual morning medications can be taken with sips of water including aspirin 81 mg. ? ?Confirmed patient has responsible adult to drive home post procedure and be with patient first 24 hours after arriving home: ? ?One visitor is allowed to stay in the waiting room during the time you are at the hospital for your procedure.  ? ?Patient reports no symptoms concerning for COVID-19 in the past 10 days. ? ?Reviewed procedure instructions with patient.  ? ?

## 2021-08-12 ENCOUNTER — Ambulatory Visit (HOSPITAL_COMMUNITY)
Admission: RE | Admit: 2021-08-12 | Discharge: 2021-08-12 | Disposition: A | Discharge status: Home / Self Care | Payer: Medicare Other | Attending: Cardiology | Admitting: Cardiology

## 2021-08-12 ENCOUNTER — Other Ambulatory Visit (HOSPITAL_COMMUNITY): Payer: Self-pay

## 2021-08-12 ENCOUNTER — Other Ambulatory Visit: Payer: Self-pay

## 2021-08-12 ENCOUNTER — Encounter (HOSPITAL_COMMUNITY)
Admission: RE | Disposition: A | Discharge status: Home / Self Care | Payer: Self-pay | Source: Home / Self Care | Attending: Cardiology

## 2021-08-12 DIAGNOSIS — Z8616 Personal history of COVID-19: Secondary | ICD-10-CM | POA: Diagnosis not present

## 2021-08-12 DIAGNOSIS — I25810 Atherosclerosis of coronary artery bypass graft(s) without angina pectoris: Secondary | ICD-10-CM | POA: Diagnosis present

## 2021-08-12 DIAGNOSIS — Z87891 Personal history of nicotine dependence: Secondary | ICD-10-CM | POA: Insufficient documentation

## 2021-08-12 DIAGNOSIS — I471 Supraventricular tachycardia: Secondary | ICD-10-CM | POA: Insufficient documentation

## 2021-08-12 DIAGNOSIS — E785 Hyperlipidemia, unspecified: Secondary | ICD-10-CM | POA: Diagnosis present

## 2021-08-12 DIAGNOSIS — I252 Old myocardial infarction: Secondary | ICD-10-CM | POA: Diagnosis not present

## 2021-08-12 DIAGNOSIS — Z79899 Other long term (current) drug therapy: Secondary | ICD-10-CM | POA: Diagnosis not present

## 2021-08-12 DIAGNOSIS — I25119 Atherosclerotic heart disease of native coronary artery with unspecified angina pectoris: Secondary | ICD-10-CM | POA: Diagnosis not present

## 2021-08-12 DIAGNOSIS — Z7982 Long term (current) use of aspirin: Secondary | ICD-10-CM | POA: Diagnosis not present

## 2021-08-12 DIAGNOSIS — I10 Essential (primary) hypertension: Secondary | ICD-10-CM | POA: Diagnosis not present

## 2021-08-12 DIAGNOSIS — Z955 Presence of coronary angioplasty implant and graft: Secondary | ICD-10-CM | POA: Diagnosis not present

## 2021-08-12 DIAGNOSIS — I209 Angina pectoris, unspecified: Secondary | ICD-10-CM | POA: Diagnosis present

## 2021-08-12 DIAGNOSIS — Z8551 Personal history of malignant neoplasm of bladder: Secondary | ICD-10-CM | POA: Insufficient documentation

## 2021-08-12 DIAGNOSIS — I7121 Aneurysm of the ascending aorta, without rupture: Secondary | ICD-10-CM | POA: Diagnosis not present

## 2021-08-12 DIAGNOSIS — I25709 Atherosclerosis of coronary artery bypass graft(s), unspecified, with unspecified angina pectoris: Secondary | ICD-10-CM | POA: Insufficient documentation

## 2021-08-12 DIAGNOSIS — K219 Gastro-esophageal reflux disease without esophagitis: Secondary | ICD-10-CM | POA: Insufficient documentation

## 2021-08-12 DIAGNOSIS — R002 Palpitations: Secondary | ICD-10-CM | POA: Diagnosis not present

## 2021-08-12 DIAGNOSIS — I2582 Chronic total occlusion of coronary artery: Secondary | ICD-10-CM | POA: Insufficient documentation

## 2021-08-12 HISTORY — PX: CORONARY STENT INTERVENTION: CATH118234

## 2021-08-12 HISTORY — PX: LEFT HEART CATH AND CORS/GRAFTS ANGIOGRAPHY: CATH118250

## 2021-08-12 LAB — POCT ACTIVATED CLOTTING TIME
Activated Clotting Time: 389 seconds
Activated Clotting Time: 299 seconds

## 2021-08-12 SURGERY — LEFT HEART CATH AND CORS/GRAFTS ANGIOGRAPHY
Anesthesia: LOCAL

## 2021-08-12 MED ORDER — NITROGLYCERIN 1 MG/10 ML FOR IR/CATH LAB
INTRA_ARTERIAL | Status: DC | PRN
  Administered 2021-08-12 (×2): 200 ug via INTRACORONARY

## 2021-08-12 MED ORDER — SODIUM CHLORIDE 0.9 % IV SOLN: 250.0000 mL | INTRAVENOUS | Status: DC | PRN

## 2021-08-12 MED ORDER — SODIUM CHLORIDE 0.9 % WEIGHT BASED INFUSION
3.0000 mL/kg/h | INTRAVENOUS | Status: AC
  Administered 2021-08-12: 3 mL/kg/h via INTRAVENOUS

## 2021-08-12 MED ORDER — HEPARIN (PORCINE) IN NACL 1000-0.9 UT/500ML-% IV SOLN
INTRAVENOUS | Status: DC | PRN
  Administered 2021-08-12 (×3): 500 mL

## 2021-08-12 MED ORDER — MIDAZOLAM HCL 2 MG/2ML IJ SOLN
INTRAMUSCULAR | Status: DC | PRN
  Administered 2021-08-12: 2 mg via INTRAVENOUS

## 2021-08-12 MED ORDER — FENTANYL CITRATE (PF) 100 MCG/2ML IJ SOLN
INTRAMUSCULAR | Status: AC
  Filled 2021-08-12: qty 2

## 2021-08-12 MED ORDER — SODIUM CHLORIDE 0.9 % WEIGHT BASED INFUSION: 1.0000 mL/kg/h | INTRAVENOUS | Status: DC

## 2021-08-12 MED ORDER — SODIUM CHLORIDE 0.9% FLUSH: 3.0000 mL | Freq: Two times a day (BID) | INTRAVENOUS | Status: DC

## 2021-08-12 MED ORDER — HEPARIN SODIUM (PORCINE) 1000 UNIT/ML IJ SOLN
INTRAMUSCULAR | Status: AC
  Filled 2021-08-12: qty 10

## 2021-08-12 MED ORDER — CLOPIDOGREL BISULFATE 75 MG PO TABS
75.0000 mg | ORAL_TABLET | Freq: Every day | ORAL | 0 refills | Status: DC
  Filled 2021-08-12: qty 30, 30d supply, fill #0

## 2021-08-12 MED ORDER — VERAPAMIL HCL 2.5 MG/ML IV SOLN
INTRAVENOUS | Status: DC | PRN
  Administered 2021-08-12: 10 mL via INTRA_ARTERIAL

## 2021-08-12 MED ORDER — LIDOCAINE HCL (PF) 1 % IJ SOLN
INTRAMUSCULAR | Status: DC | PRN
  Administered 2021-08-12: 2 mL

## 2021-08-12 MED ORDER — CLOPIDOGREL BISULFATE 300 MG PO TABS
ORAL_TABLET | ORAL | Status: DC | PRN
  Administered 2021-08-12: 600 mg via ORAL

## 2021-08-12 MED ORDER — SODIUM CHLORIDE 0.9% FLUSH: 3.0000 mL | INTRAVENOUS | Status: DC | PRN

## 2021-08-12 MED ORDER — MIDAZOLAM HCL 2 MG/2ML IJ SOLN
INTRAMUSCULAR | Status: AC
  Filled 2021-08-12: qty 2

## 2021-08-12 MED ORDER — ASPIRIN 81 MG PO CHEW: 81.0000 mg | CHEWABLE_TABLET | ORAL | Status: DC

## 2021-08-12 MED ORDER — LIDOCAINE HCL (PF) 1 % IJ SOLN
INTRAMUSCULAR | Status: AC
  Filled 2021-08-12: qty 60

## 2021-08-12 MED ORDER — NITROGLYCERIN 1 MG/10 ML FOR IR/CATH LAB
INTRA_ARTERIAL | Status: AC
  Filled 2021-08-12: qty 10

## 2021-08-12 MED ORDER — CLOPIDOGREL BISULFATE 300 MG PO TABS
ORAL_TABLET | ORAL | Status: AC
  Filled 2021-08-12: qty 1

## 2021-08-12 MED ORDER — ONDANSETRON HCL 4 MG/2ML IJ SOLN: 4.0000 mg | Freq: Four times a day (QID) | INTRAMUSCULAR | Status: DC | PRN

## 2021-08-12 MED ORDER — VERAPAMIL HCL 2.5 MG/ML IV SOLN
INTRAVENOUS | Status: AC
  Filled 2021-08-12: qty 2

## 2021-08-12 MED ORDER — HEPARIN SODIUM (PORCINE) 1000 UNIT/ML IJ SOLN
INTRAMUSCULAR | Status: DC | PRN
  Administered 2021-08-12: 5000 [IU] via INTRAVENOUS
  Administered 2021-08-12: 6000 [IU] via INTRAVENOUS

## 2021-08-12 MED ORDER — CLOPIDOGREL BISULFATE 75 MG PO TABS: 75.0000 mg | ORAL_TABLET | Freq: Every day | ORAL | Status: DC

## 2021-08-12 MED ORDER — ACETAMINOPHEN 325 MG PO TABS: 650.0000 mg | ORAL_TABLET | ORAL | Status: DC | PRN

## 2021-08-12 MED ORDER — HEPARIN (PORCINE) IN NACL 1000-0.9 UT/500ML-% IV SOLN
INTRAVENOUS | Status: AC
  Filled 2021-08-12: qty 500

## 2021-08-12 MED ORDER — IOHEXOL 350 MG/ML SOLN
INTRAVENOUS | Status: DC | PRN
  Administered 2021-08-12: 215 mL

## 2021-08-12 MED ORDER — HEPARIN (PORCINE) IN NACL 1000-0.9 UT/500ML-% IV SOLN
INTRAVENOUS | Status: AC
  Filled 2021-08-12: qty 1000

## 2021-08-12 MED ORDER — PANTOPRAZOLE SODIUM 40 MG PO TBEC
40.0000 mg | DELAYED_RELEASE_TABLET | Freq: Every day | ORAL | 1 refills | Status: DC
  Filled 2021-08-12: qty 30, 30d supply, fill #0

## 2021-08-12 MED ORDER — FENTANYL CITRATE (PF) 100 MCG/2ML IJ SOLN
INTRAMUSCULAR | Status: DC | PRN
  Administered 2021-08-12: 25 ug via INTRAVENOUS
  Administered 2021-08-12: 50 ug via INTRAVENOUS

## 2021-08-12 SURGICAL SUPPLY — 21 items
BALLN SAPPHIRE 2.0X12 (BALLOONS) ×2
BALLN SAPPHIRE 2.5X12 (BALLOONS) ×4
BALLOON SAPPHIRE 2.0X12 (BALLOONS) IMPLANT
BALLOON SAPPHIRE 2.5X12 (BALLOONS) IMPLANT
CATH GUIDEZILLA II 6F (CATHETERS) IMPLANT
CATH INFINITI 5 FR IM (CATHETERS) ×1 IMPLANT
CATH INFINITI 5FR AL1 (CATHETERS) ×1 IMPLANT
CATH INFINITI 5FR MULTPACK ANG (CATHETERS) ×1 IMPLANT
CATH LAUNCHER 6FR EBU 4 (CATHETERS) ×1 IMPLANT
CATH LAUNCHER 6FR EBU3.5 (CATHETERS) ×1 IMPLANT
CATHETER GUIDEZILLA II 6F (CATHETERS) ×2
DEVICE RAD COMP TR BAND LRG (VASCULAR PRODUCTS) ×1 IMPLANT
GLIDESHEATH SLEND SS 6F .021 (SHEATH) ×1 IMPLANT
GUIDEWIRE INQWIRE 1.5J.035X260 (WIRE) IMPLANT
INQWIRE 1.5J .035X260CM (WIRE) ×2
KIT ENCORE 26 ADVANTAGE (KITS) ×1 IMPLANT
KIT HEART LEFT (KITS) ×2 IMPLANT
PACK CARDIAC CATHETERIZATION (CUSTOM PROCEDURE TRAY) ×2 IMPLANT
TRANSDUCER W/STOPCOCK (MISCELLANEOUS) ×2 IMPLANT
TUBING CIL FLEX 10 FLL-RA (TUBING) ×2 IMPLANT
WIRE ASAHI PROWATER 180CM (WIRE) ×2 IMPLANT

## 2021-08-12 NOTE — Discharge Instructions (Signed)

## 2021-08-12 NOTE — Interval H&P Note (Signed)
History and Physical Interval Note: ? ?08/12/2021 ?7:18 AM ? ?Kyle Baldwin  has presented today for surgery, with the diagnosis of CAD.  The various methods of treatment have been discussed with the patient and family. After consideration of risks, benefits and other options for treatment, the patient has consented to  Procedure(s): ?LEFT HEART CATH AND CORS/GRAFTS ANGIOGRAPHY (N/A) as a surgical intervention.  The patient's history has been reviewed, patient examined, no change in status, stable for surgery.  I have reviewed the patient's chart and labs.  Questions were answered to the patient's satisfaction.   ? ?Cath Lab Visit (complete for each Cath Lab visit) ? ?Clinical Evaluation Leading to the Procedure:  ? ?ACS: No. ? ?Non-ACS:   ? ?Anginal Classification: CCS II ? ?Anti-ischemic medical therapy: Minimal Therapy (1 class of medications) ? ?Non-Invasive Test Results: No non-invasive testing performed ? ?Prior CABG: Previous CABG ? ? ? ? ? ? ?Kyle Baldwin [facility] ?08/12/2021 ?7:19 AM ? ? ? ?

## 2021-08-12 NOTE — Discharge Summary (Signed)
?Discharge Summary for Same Day PCI  ? ?Patient ID: Kyle Baldwin ?MRN: [id number]; DOB: 12-Jul-1945 ? ?Admit date: 08/12/2021 ?Discharge date: 08/12/2021 ? ?Primary Care Provider: [name], MD  ?Primary Cardiologist: Peter Martinique, MD  ?Primary Electrophysiologist:  None  ? ?Discharge Diagnoses  ?  ?Principal Problem: ?  Angina pectoris ([place]) ?Active Problems: ?  CAD (coronary artery disease) of artery bypass graft ?  HTN (hypertension) ?  Hyperlipidemia ? ? ?Diagnostic Studies/Procedures  ?  ?Cardiac Catheterization 08/12/2021: ? ?  Dist RCA lesion is 100% stenosed. ?  Mid LAD lesion is 80% stenosed. ?  Mid LAD to Dist LAD lesion is 90% stenosed. ?  1st Diag lesion is 60% stenosed. ?  1st Mrg lesion is 90% stenosed. ?  Balloon angioplasty was performed using a BALLN SAPPHIRE 2.5X12. ?  Post intervention, there is a 25% residual stenosis. ?  SVG graft was visualized by angiography and is normal in caliber. ?  LIMA graft was visualized by angiography and is normal in caliber. ?  The graft exhibits no disease. ?  The graft exhibits no disease. ?  The left ventricular systolic function is normal. ?  LV end diastolic pressure is normal. ?  The left ventricular ejection fraction is 55-65% by visual estimate. ?  ?3 vessel obstructive CAD ?     - diffuse 90 % mid to distal LAD. 65% proximal first diagonal ?     - 90% first OM ?     - 100% distal RCA ?2.   Patent LIMA to the LAD ?3.   Patent SVG to PDA/PL ?4.   Normal LV function ?5.   Normal LVEDP ?6.   Successful POBA of the first OM. Unable to deliver stent due to vessel tortuosity. ?  ?  ?Plan: would continue DAPT with ASA and Plavix for one month. Monitor clinically. If patient has restenosis of the OM would need to consider more aggressive approach from femoral artery. Would start with a 4 cm guide and consider heavy duty wire. Kyle Baldwin support offered little assistance.  ? ?Diagnostic ?Dominance: Right ?Intervention ? ? ?  ?_____________ ?  ?History of  Present Illness   ?  ?Kyle Baldwin is a 76 y.o. male with PMH of CAD s/p CABG '07 (SVG to the PDA and PLOM and LIMA to the LAD), HTN, HLD, GERD, Bladder Ca and palpitations who presented to the office on 3/2 with chest pain. He was seen in the ED on 12//31/19 with an episode of SOB. Stated he awoke suddenly from sleep and couldn't breathe. Felt like his throat was closing up. Lasted about 5 minutes. No tongue swelling.  Symptoms resolved. CXR and Ecg were OK. Labs remarkable for creatinine 1.28.  ?  ?He was seen in the ED in August 2020 with vertigo. Evaluation was negative.  ?  ?He was diagnosed with COVID 19 in September 2021. Received monoclonal antibodies. Subsequently complained of palpitations. Event monitor placed. Event monitor showed rare PACs and PVCs. <1%. 4 brief runs of SVT longest 6 beats. No therapy recommended.  ?  ?At recent office visit 2/16 he noted that since the summer he'd had more palpitations. Notes HR is faster and he has more forceful beats. He feels lightheaded with this and has chest pressure. Noted more ankle swelling L>R. He also notes chest pressure is worse after eating. It was felt this may have been his anginal equivalent. Metoprolol was increased to '50mg'$  BID and set up for echo.  ? ?  Seen back in the office on 3/2. 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 Reported improvement in the palpitations but was still experiencing chest pressure. ? ?Cardiac catheterization was arranged for further evaluation. ? ?Hospital Course  ?   ?The patient underwent cardiac cath as noted above with patent LIMA-LAD, SVG-PDA/PL, 90% 1st OM treated with POBA but unable to deliver stent due to vessel tortuosity. Plan for DAPT with ASA/plavix for at least one month. The patient was seen by cardiac rehab while in short stay. There were no observed complications post cath. Radial cath site was re-evaluated prior to discharge and found to be stable without any complications. Instructions/precautions  regarding cath site care were given prior to discharge. ? ?Kyle Baldwin was seen by Dr. Martinique and determined stable for discharge home. Follow up with our office has been arranged. Medications are listed below. Pertinent changes include addition of plavix, switched to protonix. ? ?_____________ ? ?Cath/PCI Registry Performance & Quality Measures: ?Aspirin prescribed? - Yes ?ADP Receptor Inhibitor (Plavix/Clopidogrel, Brilinta/Ticagrelor or Effient/Prasugrel) prescribed (includes medically managed patients)? - Yes ?High Intensity Statin (Lipitor 40-'80mg'$  or Crestor 20-'40mg'$ ) prescribed? - No - hx of myaglias ?For EF <40%, was ACEI/ARB prescribed? - Not Applicable (EF >/= 16%) ?For EF <40%, Aldosterone Antagonist (Spironolactone or Eplerenone) prescribed? - Not Applicable (EF >/= 10%) ?Cardiac Rehab Phase II ordered (Included Medically managed Patients)? - Yes ? ?_____________ ? ? ?Discharge Vitals ?Blood pressure (!) 111/50, pulse (!) 56, temperature 98.3 ?F (36.8 ?C), resp. rate 14, height 6' (1.829 m), weight 108.9 kg, SpO2 99 %.  Kyle Baldwin Weights  ? 08/12/21 0523  ?Weight: 108.9 kg  ? ? ?Last Labs & Radiologic Studies  ?  ?CBC ?No results for input(s): WBC, NEUTROABS, HGB, HCT, MCV, PLT in the last 72 hours. ?Basic Metabolic Panel ?No results for input(s): NA, K, CL, CO2, GLUCOSE, BUN, CREATININE, CALCIUM, MG, PHOS in the last 72 hours. ?Liver Function Tests ?No results for input(s): AST, ALT, ALKPHOS, BILITOT, PROT, ALBUMIN in the last 72 hours. ?No results for input(s): LIPASE, AMYLASE in the last 72 hours. ?High Sensitivity Troponin:   ?No results for input(s): TROPONINIHS in the last 720 hours.  ?BNP ?Invalid input(s): POCBNP ?D-Dimer ?No results for input(s): DDIMER in the last 72 hours. ?Hemoglobin A1C ?No results for input(s): HGBA1C in the last 72 hours. ?Fasting Lipid Panel ?No results for input(s): CHOL, HDL, LDLCALC, TRIG, CHOLHDL, LDLDIRECT in the last 72 hours. ?Thyroid Function Tests ?No results for  input(s): TSH, T4TOTAL, T3FREE, THYROIDAB in the last 72 hours. ? ?Invalid input(s): FREET3 ?_____________  ?CARDIAC CATHETERIZATION ? ?Result Date: 08/12/2021 ?  Dist RCA lesion is 100% stenosed.   Mid LAD lesion is 80% stenosed.   Mid LAD to Dist LAD lesion is 90% stenosed.   1st Diag lesion is 60% stenosed.   1st Mrg lesion is 90% stenosed.   Balloon angioplasty was performed using a BALLN SAPPHIRE 2.5X12.   Post intervention, there is a 25% residual stenosis.   SVG graft was visualized by angiography and is normal in caliber.   LIMA graft was visualized by angiography and is normal in caliber.   The graft exhibits no disease.   The graft exhibits no disease.   The left ventricular systolic function is normal.   LV end diastolic pressure is normal.   The left ventricular ejection fraction is 55-65% by visual estimate. 3 vessel obstructive CAD      - diffuse 90 % mid to distal LAD. 65%  proximal first diagonal      - 90% first OM      - 100% distal RCA 2.   Patent LIMA to the LAD 3.   Patent SVG to PDA/PL 4.   Normal LV function 5.   Normal LVEDP 6.   Successful POBA of the first OM. Unable to deliver stent due to vessel tortuosity. Plan: would continue DAPT with ASA and Plavix for one month. Monitor clinically. If patient has restenosis of the OM would need to consider more aggressive approach from femoral artery. Would start with a 4 cm guide and consider heavy duty wire. Kyle Baldwin support offered little assistance.  ? ?ECHOCARDIOGRAM COMPLETE ? ?Result Date: 07/27/2021 ?   ECHOCARDIOGRAM REPORT   Patient Name:   Kyle Baldwin Date of Exam: 07/27/2021 Medical Rec #:  [id number]       Height:       72.0 in Accession #:    [id number]      Weight:       241.8 lb Date of Birth:  [birth year]        BSA:          2.310 m? Patient Age:    64 years        BP:           119/81 mmHg Patient Gender: M               HR:           75 bpm. Exam Location:  [address] Procedure: 2D Echo, Cardiac Doppler, Color Doppler and Strain  Analysis Indications:    I25.810 CAD  History:        Patient has no prior history of Echocardiogram examinations.                 CAD, Prior CABG; Risk Factors:Hypertension, Dyslipidemia and

## 2021-08-12 NOTE — Progress Notes (Signed)
CARDIAC REHAB PHASE I  ? ?PTCA education completed with pt. Pt educated on importance of ASA and Plavix. Pt given heart healthy diet. Reviewed site care, restrictions, and exercise guidelines. Will refer to CRP II [place]. ? ?[phone] ?Rufina Falco, RN BSN ?08/12/2021 ?11:57 AM ? ?

## 2021-08-13 ENCOUNTER — Encounter (HOSPITAL_COMMUNITY): Payer: Self-pay | Admitting: Cardiology

## 2021-08-17 ENCOUNTER — Telehealth (HOSPITAL_COMMUNITY): Payer: Self-pay

## 2021-08-17 NOTE — Telephone Encounter (Signed)
Pharmacy Transitions of Care Follow-up Telephone Call ? ?Date of discharge: 08/12/21  ?Discharge Diagnosis: coronary angioplasty ? ?How have you been since you were released from the hospital? Patient doing well since discharge, no questions about meds at this time.  ? ?Medication changes made at discharge: ?    START taking: ?clopidogrel (PLAVIX)  ?pantoprazole (Protonix)  ?STOP taking: ?omeprazole 20 MG capsule (PRILOSEC)  ? ?Medication changes verified by the patient? Yes ?  ? ?Medication Accessibility: ? ?Home Pharmacy: Not discussed  ? ?Was the patient provided with refills on discharged medications? No  ? ?Have all prescriptions been transferred from [facility] to home pharmacy? N/A  ? ?Is the patient able to afford medications? Has insurance ?  ? ?Medication Review: ? ?CLOPIDOGREL (PLAVIX) ?Clopidogrel 75 mg once daily.  ?- Advised patient of medications to avoid (NSAIDs, ASA)  ?- Educated that Tylenol (acetaminophen) will be the preferred analgesic to prevent risk of bleeding  ?- Emphasized importance of monitoring for signs and symptoms of bleeding (abnormal bruising, prolonged bleeding, nose bleeds, bleeding from gums, discolored urine, black tarry stools)  ?- Advised patient to alert all providers of anticoagulation therapy prior to starting a new medication or having a procedure  ? ?Follow-up Appointments: ? ?[facility] f/u appt confirmed? Scheduled to see Dr. Martinique on 08/30/21 @ 10:40AM.  ? ?If their condition worsens, is the pt aware to call PCP or go to the Emergency Dept.? Yes ? ?Final Patient Assessment: ?Patient has f/u scheduled and knows to get refills at f/u ? ?

## 2021-08-24 NOTE — Progress Notes (Signed)
?  ?Cardiology Office Note ? ? ?Date:  08/30/2021  ? ?ID:  CORWYN VORA, DOB 13-Jul-1945, MRN [id number] ? ?PCP:  [name], MD  ?Cardiologist:   Roe Wilner Martinique, MD  ? ?Chief Complaint  ?Patient presents with  ? Coronary Artery Disease  ? ? ?  ?History of Present Illness: ?Kyle Baldwin is a 76 y.o. male who is seen for follow up post cardiac cath. He has a history of CAD s/p CABG in 2007 by Dr. Servando Snare. This included a sequential SVG to the PDA and PLOM and LIMA to the LAD.  He also has a history of HTN and Hyperlipidemia.  ? ?He was seen in the ED on 12//31/19 with an episode of SOB. States he awoke suddenly from sleep and couldn't breathe. Felt like his throat was closing up. Lasted about 5 minutes. No tongue swelling.  Symptoms resolved. CXR and Ecg were OK. Labs remarkable for creatinine 1.28.  ? ?He was seen in the ED in August 2020 with vertigo. Evaluation was negative.  ? ?He was diagnosed with COVID 19 in September 2021. Received monoclonal antibodies. Subsequently complained of palpitations. Event monitor placed. Event monitor showed rare PACs and PVCs. <1%. 4 brief runs of SVT longest 6 beats. No therapy recommended.  ? ?When seen recently he noted that since the summer he has noted more palpitations. Notes HR is faster and he has more forceful beats. He feels lightheaded with this and has chest pressure. Notes more ankle swelling L>R. He also notes chest pressure is worse after eating. We stopped his amlodipine and increased his metoprolol dose. Echo was ordered and showed a proximal aortic aneurysm at 4.4 cm otherwise normal. On follow up today  he notes significant improvement in his palpitations with the increased metoprolol but is still experiencing the chest pressure. Echo showed normal LV and valvular function. Subsequent cardiac cath showed patent grafts to the LAD and RCA. There was a significant stenosis in OM1. This was treated with balloon angioplasty. Unable to deliver a stent due to  significant angulation and tortuosity of vessel. Was DC on ASA and Plavix for one month. ? ?On follow up he still notes some anginal symptoms but overall feels much better since his cath. Less nauseated. Eating healthier and has lost 8 lbs. Does feel tired. BP controlled.  ? ? ?Past Medical History:  ?Diagnosis Date  ? Bilateral carpal tunnel syndrome   ? Bladder cancer ([place]) 2005  ? bladder cancer in situ 2005  ? CAD (coronary artery disease)   ? Diverticulosis   ? Fatty liver   ? GERD (gastroesophageal reflux disease)   ? Hyperlipidemia   ? Hypertension   ? Kidney stones   ? Liver cyst right  ? hepatic lobe cyst  ? Myocardial infarction [facility]) 2007  ? heart bypass 2007  ? Vitamin B 12 deficiency   ? ? ?Past Surgical History:  ?Procedure Laterality Date  ? BLADDER SURGERY  2005  ? CORONARY STENT INTERVENTION N/A 08/12/2021  ? Procedure: CORONARY STENT INTERVENTION;  Surgeon: Martinique, Kesler Wickham M, MD;  Location: [place] [facility];  Service: Cardiovascular;  Laterality: N/A;  ? heart bypass  2007  ? 3 vessels  ? LEFT HEART CATH AND CORS/GRAFTS ANGIOGRAPHY N/A 08/12/2021  ? Procedure: LEFT HEART CATH AND CORS/GRAFTS ANGIOGRAPHY;  Surgeon: Martinique, Rylann Munford M, MD;  Location: [place] [facility];  Service: Cardiovascular;  Laterality: N/A;  ? LUMBAR LAMINECTOMY    ? ? ? ?Current Outpatient Medications  ?  Medication Sig Dispense Refill  ? aspirin EC 81 MG tablet Take 81 mg by mouth daily.    ? cyanocobalamin 1000 MCG tablet Take 1,000 mcg by mouth daily.     ? losartan (COZAAR) 100 MG tablet Take 100 mg by mouth daily.     ? metoprolol tartrate (LOPRESSOR) 50 MG tablet Take 1 tablet (50 mg total) by mouth 2 (two) times daily. 180 tablet 3  ? pantoprazole (PROTONIX) 40 MG tablet Take 1 tablet (40 mg total) by mouth daily. 30 tablet 1  ? Polyethyl Glycol-Propyl Glycol (SYSTANE) 0.4-0.3 % SOLN Place 1 drop into both eyes daily as needed (Dry eye).    ? simvastatin (ZOCOR) 20 MG tablet Take 20 mg by mouth daily.    ? sodium chloride  (OCEAN) 0.65 % SOLN nasal spray Place 1 spray into both nostrils at bedtime.    ? Vitamin D, Ergocalciferol, (DRISDOL) 1.25 MG (50000 UNIT) CAPS capsule Take 50,000 Units by mouth once a week.    ? nitroGLYCERIN (NITROSTAT) 0.4 MG SL tablet Place 1 tablet (0.4 mg total) under the tongue every 5 (five) minutes as needed for chest pain. 25 tablet 6  ? ?No current facility-administered medications for this visit.  ? ? ?Allergies:   Codeine, Morphine and related, and Lisinopril  ? ? ?Social History:  The patient  reports that he quit smoking about 53 years ago. His smoking use included cigarettes. He has never used smokeless tobacco. He reports that he does not drink alcohol and does not use drugs.  ? ?Family History:  The patient's family history includes Colon cancer (age of onset: 60) in his maternal grandfather; Diabetes in his maternal grandmother; Heart disease in his maternal grandmother.  ? ? ?ROS:  Please see the history of present illness.   Otherwise, review of systems are positive for none.   All other systems are reviewed and negative.  ? ? ?PHYSICAL EXAM: ?VS:  BP 124/80   Pulse 63   Ht 6' (1.829 m)   Wt 233 lb (105.7 kg)   SpO2 97%   BMI 31.60 kg/m?  , BMI Body mass index is 31.6 kg/m?. ?GENERAL:  Well appearing overweight WM in NAD. ?HEENT:  PERRL, EOMI, sclera are clear. Oropharynx is clear. ?NECK:  No jugular venous distention, carotid upstroke brisk and symmetric, no bruits, no thyromegaly or adenopathy ?LUNGS:  Clear to auscultation bilaterally ?CHEST:  Unremarkable ?HEART:  RRR,  PMI not displaced or sustained,S1 and S2 within normal limits, no S3, no S4: no clicks, no rubs, no murmurs ?ABD:  Soft, nontender. BS +, no masses or bruits. No hepatomegaly, no splenomegaly ?EXT:  2 + pulses throughout, no edema, no cyanosis no clubbing ?SKIN:  Warm and dry.  No rashes ?NEURO:  Alert and oriented x 3. Cranial nerves II through XII intact. ?PSYCH:  Cognitively intact ? ? ? ?EKG:  EKG is  not ordered  today.  ? ? ? ? ?Recent Labs: ?07/15/2021: TSH 1.300 ?07/29/2021: BUN 22; Creatinine, Ser 1.20; Hemoglobin 14.2; Platelets 181; Potassium 4.4; Sodium 141  ? ? ?Lipid Panel ?No results found for: CHOL, TRIG, HDL, CHOLHDL, VLDL, LDLCALC, LDLDIRECT ?  ? ?Wt Readings from Last 3 Encounters:  ?08/30/21 233 lb (105.7 kg)  ?08/12/21 240 lb (108.9 kg)  ?07/29/21 238 lb 9.6 oz (108.2 kg)  ?  ? ?Labs dated 02/05/16: cholesterol 126, triglycerides 91, LDL 74, HDL 34. A1c 5.2%. CMET, TSH, CBC normal ?Dated 02/09/17: cholesterol 130, triglycerides 111, HDL 33, LDL  75. A1c 5.2%. Creatinine 1.2. CBC and ALT normal. ?Dated 03/09/18: cholesterol 116, triglycerides 86, HDL 27. LDL 72. ALT normal. A1c 5.3%.  ?Dated 05/29/18: creatinine 1.28. CBC normal.  ?Dated 03/15/19: cholesterol 136, triglycerides 100, HDL 34, LDL 82. A1c 5.3%. creatinine 1.2. Otherwise CMET and CBC normal ?Dated 03/20/20: cholesterol 111, triglycerides 82., HDL 34, LDL 61. A1c 5.2%. BUN 20, creatinine 1.2. otherwise CMET and CBC normal. ?Dated 04/05/21: cholesterol 119, triglycerides 70, HDL 37, LDL 68. A1c 5.4%. potassium normal. ALT normal. ? ?Other studies Reviewed: ?Additional studies/ records that were reviewed today include: Myoview 06/23/15: ?Review of the above records demonstrates: Study Highlights  ? ?Nuclear stress EF: 51%. ?The left ventricular ejection fraction is mildly decreased (45-54%). ?There was no ST segment deviation noted during stress. ?No T wave inversion was noted during stress. ?Defect 1: There is a small defect of mild severity present in the basal inferior location. ?This is a low risk study. ?  ?Low risk stress nuclear study with a small fixed basal inferior fixed defect (small scar versus diaphragmatic attenuation artifact). No reversible ischemia is seen. ?Borderline left ventricular global systolic function  ? ?Event monitor. 03/19/20:Study Highlights ? ?Normal sinus rhythm ?Rare PACs and PVCs ?Few brief runs of SVT less than 6  beats. ? ? ?Echo 07/27/21: IMPRESSIONS  ? ? ? 1. Left ventricular ejection fraction, by estimation, is 60 to 65%. The  ?left ventricle has normal function. The left ventricle has no regional  ?wall motion abnormalities. L

## 2021-08-30 ENCOUNTER — Telehealth (HOSPITAL_COMMUNITY): Payer: Self-pay | Admitting: *Deleted

## 2021-08-30 ENCOUNTER — Ambulatory Visit: Payer: Medicare Other | Admitting: Cardiology

## 2021-08-30 ENCOUNTER — Encounter: Payer: Self-pay | Admitting: Cardiology

## 2021-08-30 VITALS — BP 124/80 | HR 63 | Ht 72.0 in | Wt 233.0 lb

## 2021-08-30 DIAGNOSIS — E7800 Pure hypercholesterolemia, unspecified: Secondary | ICD-10-CM

## 2021-08-30 DIAGNOSIS — I10 Essential (primary) hypertension: Secondary | ICD-10-CM

## 2021-08-30 DIAGNOSIS — Z9861 Coronary angioplasty status: Secondary | ICD-10-CM | POA: Diagnosis not present

## 2021-08-30 DIAGNOSIS — I7121 Aneurysm of the ascending aorta, without rupture: Secondary | ICD-10-CM

## 2021-08-30 DIAGNOSIS — I209 Angina pectoris, unspecified: Secondary | ICD-10-CM

## 2021-08-30 DIAGNOSIS — R002 Palpitations: Secondary | ICD-10-CM

## 2021-08-30 MED ORDER — NITROGLYCERIN 0.4 MG SL SUBL: 0.4000 mg | SUBLINGUAL_TABLET | SUBLINGUAL | 6 refills | Status: AC | PRN

## 2021-08-30 NOTE — Telephone Encounter (Signed)
-----   Message from Peter M Martinique, MD sent at 08/30/2021  1:57 PM EDT ----- ?Regarding: RE: Cardiac Rehab ?I think 20 lbs limit. No other restrictions. Would like BP < 160 ? ?Peter Martinique MD, [facility] ? ? ?----- Message ----- ?From: Rowe Pavy, RN ?Sent: 08/30/2021   1:29 PM EDT ?To: Peter M Martinique, MD ?Subject: Cardiac Rehab                                 ? ? ?Dr. Martinique, ? ?I received notice that this pt is clear to begin cardiac rehab s/p 3/16 PTCA 1st Marg.  Seen today - 4/3.  Noted in medical history 4.4 aortic aneurysm.  I see that he should avoid heavy lifting and straining. ? ?What weight limitation for hand weights? ? Any BP parameters pre, during, post exercise? ?Any other activities he should avoid while exercising here at cardiac rehab? ? ?Thanks for your input ?Cherre Huger, BSN ?Cardiac and Pulmonary Rehab Nurse Navigator  ? ? ? ?

## 2021-10-29 ENCOUNTER — Telehealth (HOSPITAL_COMMUNITY): Payer: Self-pay

## 2021-10-29 NOTE — Telephone Encounter (Signed)
Pt is not interested in the cardiac rehab program. Closed referral 

## 2021-12-24 LAB — LIPID PANEL
Chol/HDL Ratio: 3.5 ratio (ref 0.0–5.0)
Cholesterol, Total: 127 mg/dL (ref 100–199)
HDL: 36 mg/dL — ABNORMAL LOW (ref >39)
LDL Chol Calc (NIH): 75 mg/dL (ref 0–99)
Triglycerides: 81 mg/dL (ref 0–149)
VLDL Cholesterol Cal: 16 mg/dL (ref 5–40)

## 2021-12-24 LAB — HEPATIC FUNCTION PANEL
ALT: 15 IU/L (ref 0–44)
AST: 13 IU/L (ref 0–40)
Albumin: 4.3 g/dL (ref 3.8–4.8)
Alkaline Phosphatase: 59 IU/L (ref 44–121)
Bilirubin Total: 1 mg/dL (ref 0.0–1.2)
Bilirubin, Direct: 0.34 mg/dL (ref 0.00–0.40)
Total Protein: 6.2 g/dL (ref 6.0–8.5)

## 2021-12-24 LAB — BASIC METABOLIC PANEL
BUN/Creatinine Ratio: 17 (ref 10–24)
BUN: 23 mg/dL (ref 8–27)
CO2: 26 mmol/L (ref 20–29)
Calcium: 9.2 mg/dL (ref 8.6–10.2)
Chloride: 105 mmol/L (ref 96–106)
Creatinine, Ser: 1.35 mg/dL — ABNORMAL HIGH (ref 0.76–1.27)
Glucose: 103 mg/dL — ABNORMAL HIGH (ref 70–99)
Potassium: 4.9 mmol/L (ref 3.5–5.2)
Sodium: 142 mmol/L (ref 134–144)
eGFR: 55 mL/min/{1.73_m2} — ABNORMAL LOW (ref >59)

## 2021-12-24 NOTE — Progress Notes (Signed)
Cardiology Office Note   Date:  12/31/2021   ID:  Kyle Baldwin, [place] 04-16-1946, MRN [id number]  PCP:  Kyle Lopes, MD  Cardiologist:   Kyle Harman Martin[place], MD   Chief Complaint  Patient presents with   Coronary Artery Disease      History of Present Illness: Kyle Baldwin is a 76 y.o. male who is seen for follow up post cardiac cath. He has a history of CAD s/p CABG in 2007 by Dr. Servando Baldwin. This included a sequential SVG to the PDA and PLOM and LIMA to the LAD.  He also has a history of HTN and Hyperlipidemia.   He was seen in the ED on 12//31/19 with an episode of SOB. States he awoke suddenly from sleep and couldn't breathe. Felt like his throat was closing up. Lasted about 5 minutes. No tongue swelling.  Symptoms resolved. CXR and Ecg were OK. Labs remarkable for creatinine 1.28.   He was seen in the ED in August 2020 with vertigo. Evaluation was negative.   He was diagnosed with COVID 19 in September 2021. Received monoclonal antibodies. Subsequently complained of palpitations. Event monitor placed. Event monitor showed rare PACs and PVCs. <1%. 4 brief runs of SVT longest 6 beats. No therapy recommended.   When seen recently he noted that since the summer he has noted more palpitations. Notes HR is faster and he has more forceful beats. He feels lightheaded with this and has chest pressure. Notes more ankle swelling L>R. He also notes chest pressure is worse after eating. We stopped his amlodipine and increased his metoprolol dose. Echo was ordered and showed a proximal aortic aneurysm at 4.4 cm otherwise normal. On follow up today  he notes significant improvement in his palpitations with the increased metoprolol but is still experiencing the chest pressure. Echo showed normal LV and valvular function. Subsequent cardiac cath showed patent grafts to the LAD and RCA. There was a significant stenosis in OM1. This was treated with balloon angioplasty. Unable to deliver a stent due to  significant angulation and tortuosity of vessel. Was DC on ASA and Plavix for one month.  On follow up he still notes some anginal symptoms but is active. Only notes throat pain if he over does it or walks too fast. Notes minor palpitations after eating. Legs do stay swollen.   Past Medical History:  Diagnosis Date   Bilateral carpal tunnel syndrome    Bladder cancer ([place]) 2005   bladder cancer in situ 2005   CAD (coronary artery disease)    Diverticulosis    Fatty liver    GERD (gastroesophageal reflux disease)    Hyperlipidemia    Hypertension    Kidney stones    Liver cyst right   hepatic lobe cyst   Myocardial infarction [facility]) 2007   heart bypass 2007   Vitamin B 12 deficiency     Past Surgical History:  Procedure Laterality Date   BLADDER SURGERY  2005   CORONARY STENT INTERVENTION N/A 08/12/2021   Procedure: CORONARY STENT INTERVENTION;  Surgeon: Baldwin, Kyle Baldwin M, MD;  Location: [place] [facility];  Service: Cardiovascular;  Laterality: N/A;   heart bypass  2007   3 vessels   LEFT HEART CATH AND CORS/GRAFTS ANGIOGRAPHY N/A 08/12/2021   Procedure: LEFT HEART CATH AND CORS/GRAFTS ANGIOGRAPHY;  Surgeon: Baldwin, Kyle Baldwin M, MD;  Location: [place] [facility];  Service: Cardiovascular;  Laterality: N/A;   LUMBAR LAMINECTOMY       Current  Outpatient Medications  Medication Sig Dispense Refill   aspirin EC 81 MG tablet Take 81 mg by mouth daily.     cyanocobalamin 1000 MCG tablet Take 1,000 mcg by mouth daily.      losartan (COZAAR) 100 MG tablet Take 100 mg by mouth daily.      metoprolol tartrate (LOPRESSOR) 50 MG tablet Take 1 tablet (50 mg total) by mouth 2 (two) times daily. 180 tablet 3   nitroGLYCERIN (NITROSTAT) 0.4 MG SL tablet Place 1 tablet (0.4 mg total) under the tongue every 5 (five) minutes as needed for chest pain. 25 tablet 6   omeprazole (PRILOSEC OTC) 20 MG tablet Take one tablet by mouth once daily Oral     Polyethyl Glycol-Propyl Glycol (SYSTANE) 0.4-0.3 %  SOLN Place 1 drop into both eyes daily as needed (Dry eye).     simvastatin (ZOCOR) 20 MG tablet Take 20 mg by mouth daily.     sodium chloride (OCEAN) 0.65 % SOLN nasal spray Place 1 spray into both nostrils at bedtime.     Vitamin D, Ergocalciferol, (DRISDOL) 1.25 MG (50000 UNIT) CAPS capsule Take 50,000 Units by mouth once a week.     No current facility-administered medications for this visit.    Allergies:   Codeine, Morphine and related, and Lisinopril    Social History:  The patient  reports that he quit smoking about 53 years ago. His smoking use included cigarettes. He has never used smokeless tobacco. He reports that he does not drink alcohol and does not use drugs.   Family History:  The patient's family history includes Colon cancer (age of onset: 66) in his maternal grandfather; Diabetes in his maternal grandmother; Heart disease in his maternal grandmother.    ROS:  Please see the history of present illness.   Otherwise, review of systems are positive for none.   All other systems are reviewed and negative.    PHYSICAL EXAM: VS:  BP 120/64   Pulse 66   Ht 6' (1.829 m)   Wt 234 lb 3.2 oz (106.2 kg)   SpO2 97%   BMI 31.76 kg/m  , BMI Body mass index is 31.76 kg/m. GENERAL:  Well appearing overweight WM in NAD. HEENT:  PERRL, EOMI, sclera are clear. Oropharynx is clear. NECK:  No jugular venous distention, carotid upstroke brisk and symmetric, no bruits, no thyromegaly or adenopathy LUNGS:  Clear to auscultation bilaterally CHEST:  Unremarkable HEART:  RRR,  PMI not displaced or sustained,S1 and S2 within normal limits, no S3, no S4: no clicks, no rubs, no murmurs ABD:  Soft, nontender. BS +, no masses or bruits. No hepatomegaly, no splenomegaly EXT:  2 + pulses throughout, no pitting edema, some chronic venous stasis. SKIN:  Warm and dry.  No rashes NEURO:  Alert and oriented x 3. Cranial nerves II through XII intact. PSYCH:  Cognitively intact    EKG:  EKG is   not ordered today.      Recent Labs: 07/15/2021: TSH 1.300 07/29/2021: Hemoglobin 14.2; Platelets 181 12/24/2021: ALT 15; BUN 23; Creatinine, Ser 1.35; Potassium 4.9; Sodium 142    Lipid Panel    Component Value Date/Time   CHOL 127 12/24/2021 0920   TRIG 81 12/24/2021 0920   HDL 36 (L) 12/24/2021 0920   CHOLHDL 3.5 12/24/2021 0920   LDLCALC 75 12/24/2021 0920      Wt Readings from Last 3 Encounters:  12/31/21 234 lb 3.2 oz (106.2 kg)  08/30/21 233 lb (105.7 kg)  08/12/21 240 lb (108.9 kg)     Labs dated 02/05/16: cholesterol 126, triglycerides 91, LDL 74, HDL 34. A1c 5.2%. CMET, TSH, CBC normal Dated 02/09/17: cholesterol 130, triglycerides 111, HDL 33, LDL 75. A1c 5.2%. Creatinine 1.2. CBC and ALT normal. Dated 03/09/18: cholesterol 116, triglycerides 86, HDL 27. LDL 72. ALT normal. A1c 5.3%.  Dated 05/29/18: creatinine 1.28. CBC normal.  Dated 03/15/19: cholesterol 136, triglycerides 100, HDL 34, LDL 82. A1c 5.3%. creatinine 1.2. Otherwise CMET and CBC normal Dated 03/20/20: cholesterol 111, triglycerides 82., HDL 34, LDL 61. A1c 5.2%. BUN 20, creatinine 1.2. otherwise CMET and CBC normal. Dated 04/05/21: cholesterol 119, triglycerides 70, HDL 37, LDL 68. A1c 5.4%. potassium normal. ALT normal.  Other studies Reviewed: Additional studies/ records that were reviewed today include: Myoview 06/23/15: Review of the above records demonstrates: Study Highlights   Nuclear stress EF: 51%. The left ventricular ejection fraction is mildly decreased (45-54%). There was no ST segment deviation noted during stress. No T wave inversion was noted during stress. Defect 1: There is a small defect of mild severity present in the basal inferior location. This is a low risk study.   Low risk stress nuclear study with a small fixed basal inferior fixed defect (small scar versus diaphragmatic attenuation artifact). No reversible ischemia is seen. Borderline left ventricular global systolic  function   Event monitor. 03/19/20:Study Highlights  Normal sinus rhythm Rare PACs and PVCs Few brief runs of SVT less than 6 beats.   Echo 07/27/21: IMPRESSIONS     1. Left ventricular ejection fraction, by estimation, is 60 to 65%. The  left ventricle has normal function. The left ventricle has no regional  wall motion abnormalities. Left ventricular diastolic parameters are  consistent with Grade I diastolic  dysfunction (impaired relaxation). The average left ventricular global  longitudinal strain is -24.8 %. The global longitudinal strain is normal.   2. Right ventricular systolic function was not well visualized. The right  ventricular size is normal. Tricuspid regurgitation signal is inadequate  for assessing PA pressure.   3. The mitral valve is grossly normal. No evidence of mitral valve  regurgitation.   4. The aortic valve is grossly normal. There is mild calcification of the  aortic valve. Aortic valve regurgitation is not visualized.   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   6. The inferior vena cava is normal in size with greater than 50%  respiratory variability, suggesting right atrial pressure of 3 mmHg.   Conclusion(s)/Recommendation(s): Otherwise normal echocardiogram, with  minor abnormalities described in the report. Recommend annual surveillance  for aortic root aneurysm.    Cardiac cath 08/12/21:  CORONARY STENT INTERVENTION  LEFT HEART CATH AND CORS/GRAFTS ANGIOGRAPHY   Conclusion      Dist RCA lesion is 100% stenosed.   Mid LAD lesion is 80% stenosed.   Mid LAD to Dist LAD lesion is 90% stenosed.   1st Diag lesion is 60% stenosed.   1st Mrg lesion is 90% stenosed.   Balloon angioplasty was performed using a BALLN SAPPHIRE 2.5X12.   Post intervention, there is a 25% residual stenosis.   SVG graft was visualized by angiography and is normal in caliber.   LIMA graft was visualized by angiography and is normal in caliber.   The graft exhibits no  disease.   The graft exhibits no disease.   The left ventricular systolic function is normal.   LV end diastolic pressure is normal.   The left ventricular ejection fraction is  55-65% by visual estimate.   3 vessel obstructive CAD      - diffuse 90 % mid to distal LAD. 65% proximal first diagonal      - 90% first OM      - 100% distal RCA 2.   Patent LIMA to the LAD 3.   Patent SVG to PDA/PL 4.   Normal LV function 5.   Normal LVEDP 6.   Successful POBA of the first OM. Unable to deliver stent due to vessel tortuosity.     Plan: would continue DAPT with ASA and Plavix for one month. Monitor clinically. If patient has restenosis of the OM would need to consider more aggressive approach from femoral artery. Would start with a 4 cm guide and consider heavy duty wire. Holly Bodily support offered little assistance.   Diagnostic Dominance: Right Intervention   .card  ASSESSMENT AND PLAN:  1.  CAD s/p CABG 2007. Myoview Jan 2017 was low risk. Evaluated earlier this year with more angina. LV function normal  on Echo. Cardiac cath showed patent LIMA to the LAD and SVG to the RCA. Significant stenosis in OM1 treated with POBA. Unable to deliver stent due to vessel angulation/tortuosity. Has stable  angina class 1. Improved. Continue current therapy  2. HTN controlled.  3. Hyperlipidemia.  on Zocor. LDL up slightly to 75. Encourage good diet. Continue RX and monitor.   4. Palpitations/tachycardia.   Symptoms improved with increase in metoprolol. TSH and CBC normal  5. 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 Will need yearly follow up imaging. Instructed to avoid heavy lifting/straining. Should avoid quinolone antibiotics.   Disposition:   Signed, Ameliah Baskins [place], MD  12/31/2021 9:33 AM    [facility] [address], [place], [place], [address] Phone [phone], Fax [phone]

## 2021-12-31 ENCOUNTER — Ambulatory Visit (INDEPENDENT_AMBULATORY_CARE_PROVIDER_SITE_OTHER): Payer: Medicare Other | Admitting: Cardiology

## 2021-12-31 ENCOUNTER — Encounter: Payer: Self-pay | Admitting: Cardiology

## 2021-12-31 VITALS — BP 120/64 | HR 66 | Ht 72.0 in | Wt 234.2 lb

## 2021-12-31 DIAGNOSIS — Z9861 Coronary angioplasty status: Secondary | ICD-10-CM | POA: Diagnosis not present

## 2021-12-31 DIAGNOSIS — I10 Essential (primary) hypertension: Secondary | ICD-10-CM | POA: Diagnosis not present

## 2021-12-31 DIAGNOSIS — I25708 Atherosclerosis of coronary artery bypass graft(s), unspecified, with other forms of angina pectoris: Secondary | ICD-10-CM

## 2021-12-31 DIAGNOSIS — I209 Angina pectoris, unspecified: Secondary | ICD-10-CM | POA: Diagnosis not present

## 2021-12-31 DIAGNOSIS — R002 Palpitations: Secondary | ICD-10-CM

## 2021-12-31 DIAGNOSIS — I7121 Aneurysm of the ascending aorta, without rupture: Secondary | ICD-10-CM

## 2022-01-19 ENCOUNTER — Encounter: Payer: Self-pay | Admitting: Internal Medicine

## 2022-05-16 ENCOUNTER — Encounter (HOSPITAL_COMMUNITY): Payer: Self-pay | Admitting: Cardiology

## 2022-06-27 NOTE — Progress Notes (Unsigned)
Cardiology Office Note   Date:  07/04/2022   ID:  Kyle Baldwin, Kyle Baldwin 1946-02-07, MRN [id number]  PCP:  [name], MD  Cardiologist:   Alven Alverio Martinique, MD   Chief Complaint  Patient presents with   Coronary Artery Disease   Chest Pain      History of Present Illness: Kyle Baldwin is a 77 y.o. male who is seen for follow up post cardiac cath. He has a history of CAD s/p CABG in 2007 by Dr. Servando Snare. This included a sequential SVG to the PDA and PLOM and LIMA to the LAD.  He also has a history of HTN and Hyperlipidemia.   He was seen in the ED on 12//31/19 with an episode of SOB. States he awoke suddenly from sleep and couldn't breathe. Felt like his throat was closing up. Lasted about 5 minutes. No tongue swelling.  Symptoms resolved. CXR and Ecg were OK. Labs remarkable for creatinine 1.28.   He was seen in the ED in August 2020 with vertigo. Evaluation was negative.   He was diagnosed with COVID 19 in September 2021. Received monoclonal antibodies. Subsequently complained of palpitations. Event monitor placed. Event monitor showed rare PACs and PVCs. <1%. 4 brief runs of SVT longest 6 beats. No therapy recommended.   When seen recently he noted that since the summer he has noted more palpitations. Notes HR is faster and he has more forceful beats. He feels lightheaded with this and has chest pressure. Notes more ankle swelling L>R. He also notes chest pressure is worse after eating. We stopped his amlodipine and increased his metoprolol dose. Echo was ordered and showed a proximal aortic aneurysm at 4.4 cm otherwise normal. On follow up today  he notes significant improvement in his palpitations with the increased metoprolol but is still experiencing the chest pressure. Echo showed normal LV and valvular function. Subsequent cardiac cath showed patent grafts to the LAD and RCA. There was a significant stenosis in OM1. This was treated with balloon angioplasty. Unable to deliver  a stent due to significant angulation and tortuosity of vessel. Was DC on ASA and Plavix for one month.  On follow up he is doing well. The only time he has chest pain is after eating. He has some pain in lower sternum/epigastrium radiating to the back. He relates this to acid reflux. Has a lot of burning and brackish taste in his throat. He has some swelling and numbness in left leg. Wears a compression hose. Overall feels well and is active caring for 5 grandchildren.   Past Medical History:  Diagnosis Date   Bilateral carpal tunnel syndrome    Bladder cancer ([place]) 2005   bladder cancer in situ 2005   CAD (coronary artery disease)    Diverticulosis    Fatty liver    GERD (gastroesophageal reflux disease)    Hyperlipidemia    Hypertension    Kidney stones    Liver cyst right   hepatic lobe cyst   Myocardial infarction [facility]) 2007   heart bypass 2007   Vitamin B 12 deficiency     Past Surgical History:  Procedure Laterality Date   BLADDER SURGERY  2005   CORONARY STENT INTERVENTION N/A 08/12/2021   Procedure: CORONARY STENT INTERVENTION;  Surgeon: Martinique, Trystyn Dolley M, MD;  Location: [place] [facility];  Service: Cardiovascular;  Laterality: N/A;   heart bypass  2007   3 vessels   LEFT HEART CATH AND CORS/GRAFTS ANGIOGRAPHY N/A 08/12/2021  Procedure: LEFT HEART CATH AND CORS/GRAFTS ANGIOGRAPHY;  Surgeon: Martinique, Gladine Plude M, MD;  Location: [place] [facility];  Service: Cardiovascular;  Laterality: N/A;   LUMBAR LAMINECTOMY       Current Outpatient Medications  Medication Sig Dispense Refill   aspirin EC 81 MG tablet Take 81 mg by mouth daily.     cyanocobalamin 1000 MCG tablet Take 1,000 mcg by mouth daily.      losartan (COZAAR) 100 MG tablet Take 100 mg by mouth daily.      metoprolol tartrate (LOPRESSOR) 50 MG tablet Take 1 tablet (50 mg total) by mouth 2 (two) times daily. 180 tablet 3   nitroGLYCERIN (NITROSTAT) 0.4 MG SL tablet Place 1 tablet (0.4 mg total) under the tongue  every 5 (five) minutes as needed for chest pain. 25 tablet 6   omeprazole (PRILOSEC OTC) 20 MG tablet Take one tablet by mouth once daily Oral     Polyethyl Glycol-Propyl Glycol (SYSTANE) 0.4-0.3 % SOLN Place 1 drop into both eyes daily as needed (Dry eye).     simvastatin (ZOCOR) 40 MG tablet Take 1 tablet (40 mg total) by mouth at bedtime. 90 tablet 3   sodium chloride (OCEAN) 0.65 % SOLN nasal spray Place 1 spray into both nostrils at bedtime.     Vitamin D, Ergocalciferol, (DRISDOL) 1.25 MG (50000 UNIT) CAPS capsule Take 50,000 Units by mouth once a week.     No current facility-administered medications for this visit.    Allergies:   Codeine, Morphine and related, and Lisinopril    Social History:  The patient  reports that he quit smoking about 54 years ago. His smoking use included cigarettes. He has never used smokeless tobacco. He reports that he does not drink alcohol and does not use drugs.   Family History:  The patient's family history includes Colon cancer (age of onset: 38) in his maternal grandfather; Diabetes in his maternal grandmother; Heart disease in his maternal grandmother.    ROS:  Please see the history of present illness.   Otherwise, review of systems are positive for none.   All other systems are reviewed and negative.    PHYSICAL EXAM: VS:  BP 112/66   Pulse 70   Ht 6' (1.829 m)   Wt 234 lb 9.6 oz (106.4 kg)   SpO2 96%   BMI 31.82 kg/m  , BMI Body mass index is 31.82 kg/m. GENERAL:  Well appearing overweight WM in NAD. HEENT:  PERRL, EOMI, sclera are clear. Oropharynx is clear. NECK:  No jugular venous distention, carotid upstroke brisk and symmetric, no bruits, no thyromegaly or adenopathy LUNGS:  Clear to auscultation bilaterally CHEST:  Unremarkable HEART:  RRR,  PMI not displaced or sustained,S1 and S2 within normal limits, no S3, no S4: no clicks, no rubs, no murmurs ABD:  Soft, nontender. BS +, no masses or bruits. No hepatomegaly, no  splenomegaly EXT:  2 + pulses throughout, no pitting edema, some chronic venous stasis. SKIN:  Warm and dry.  No rashes NEURO:  Alert and oriented x 3. Cranial nerves II through XII intact. PSYCH:  Cognitively intact    EKG:  EKG is  not ordered today.      Recent Labs: 07/15/2021: TSH 1.300 07/29/2021: Hemoglobin 14.2; Platelets 181 12/24/2021: ALT 15; BUN 23; Creatinine, Ser 1.35; Potassium 4.9; Sodium 142    Lipid Panel    Component Value Date/Time   CHOL 127 12/24/2021 0920   TRIG 81 12/24/2021 0920   HDL  36 (L) 12/24/2021 0920   CHOLHDL 3.5 12/24/2021 0920   LDLCALC 75 12/24/2021 0920      Wt Readings from Last 3 Encounters:  07/04/22 234 lb 9.6 oz (106.4 kg)  12/31/21 234 lb 3.2 oz (106.2 kg)  08/30/21 233 lb (105.7 kg)     Labs dated 02/05/16: cholesterol 126, triglycerides 91, LDL 74, HDL 34. A1c 5.2%. CMET, TSH, CBC normal Dated 02/09/17: cholesterol 130, triglycerides 111, HDL 33, LDL 75. A1c 5.2%. Creatinine 1.2. CBC and ALT normal. Dated 03/09/18: cholesterol 116, triglycerides 86, HDL 27. LDL 72. ALT normal. A1c 5.3%.  Dated 05/29/18: creatinine 1.28. CBC normal.  Dated 03/15/19: cholesterol 136, triglycerides 100, HDL 34, LDL 82. A1c 5.3%. creatinine 1.2. Otherwise CMET and CBC normal Dated 03/20/20: cholesterol 111, triglycerides 82., HDL 34, LDL 61. A1c 5.2%. BUN 20, creatinine 1.2. otherwise CMET and CBC normal. Dated 04/05/21: cholesterol 119, triglycerides 70, HDL 37, LDL 68. A1c 5.4%. potassium normal. ALT normal. Dated 01/17/22: cholesterol 131, triglycerides 86, HDL 33, LDL 81. A1c 5.4%.   Other studies Reviewed: Additional studies/ records that were reviewed today include: Myoview 06/23/15: Review of the above records demonstrates: Study Highlights   Nuclear stress EF: 51%. The left ventricular ejection fraction is mildly decreased (45-54%). There was no ST segment deviation noted during stress. No T wave inversion was noted during stress. Defect 1:  There is a small defect of mild severity present in the basal inferior location. This is a low risk study.   Low risk stress nuclear study with a small fixed basal inferior fixed defect (small scar versus diaphragmatic attenuation artifact). No reversible ischemia is seen. Borderline left ventricular global systolic function   Event monitor. 03/19/20:Study Highlights  Normal sinus rhythm Rare PACs and PVCs Few brief runs of SVT less than 6 beats.   Echo 07/27/21: IMPRESSIONS     1. Left ventricular ejection fraction, by estimation, is 60 to 65%. The  left ventricle has normal function. The left ventricle has no regional  wall motion abnormalities. Left ventricular diastolic parameters are  consistent with Grade I diastolic  dysfunction (impaired relaxation). The average left ventricular global  longitudinal strain is -24.8 %. The global longitudinal strain is normal.   2. Right ventricular systolic function was not well visualized. The right  ventricular size is normal. Tricuspid regurgitation signal is inadequate  for assessing PA pressure.   3. The mitral valve is grossly normal. No evidence of mitral valve  regurgitation.   4. The aortic valve is grossly normal. There is mild calcification of the  aortic valve. Aortic valve regurgitation is not visualized.   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   6. The inferior vena cava is normal in size with greater than 50%  respiratory variability, suggesting right atrial pressure of 3 mmHg.   Conclusion(s)/Recommendation(s): Otherwise normal echocardiogram, with  minor abnormalities described in the report. Recommend annual surveillance  for aortic root aneurysm.    Cardiac cath 08/12/21:  CORONARY STENT INTERVENTION  LEFT HEART CATH AND CORS/GRAFTS ANGIOGRAPHY   Conclusion      Dist RCA lesion is 100% stenosed.   Mid LAD lesion is 80% stenosed.   Mid LAD to Dist LAD lesion is 90% stenosed.   1st Diag lesion is 60% stenosed.    1st Mrg lesion is 90% stenosed.   Balloon angioplasty was performed using a BALLN SAPPHIRE 2.5X12.   Post intervention, there is a 25% residual stenosis.   SVG graft was visualized by angiography  and is normal in caliber.   LIMA graft was visualized by angiography and is normal in caliber.   The graft exhibits no disease.   The graft exhibits no disease.   The left ventricular systolic function is normal.   LV end diastolic pressure is normal.   The left ventricular ejection fraction is 55-65% by visual estimate.   3 vessel obstructive CAD      - diffuse 90 % mid to distal LAD. 65% proximal first diagonal      - 90% first OM      - 100% distal RCA 2.   Patent LIMA to the LAD 3.   Patent SVG to PDA/PL 4.   Normal LV function 5.   Normal LVEDP 6.   Successful POBA of the first OM. Unable to deliver stent due to vessel tortuosity.     Plan: would continue DAPT with ASA and Plavix for one month. Monitor clinically. If patient has restenosis of the OM would need to consider more aggressive approach from femoral artery. Would start with a 4 cm guide and consider heavy duty wire. Holly Bodily support offered little assistance.   Diagnostic Dominance: Right Intervention   .card  ASSESSMENT AND PLAN:  1.  CAD s/p CABG 2007. Myoview Jan 2017 was low risk. Evaluated last year for chest pain. LV function normal  on Echo. Cardiac cath showed patent LIMA to the LAD and SVG to the RCA. Significant stenosis in OM1 treated with POBA. Unable to deliver stent due to vessel angulation/tortuosity. Has stable  angina class 1. Improved. Continue current therapy. Current symptoms more c/w GERD.   2. HTN controlled.  3. Hyperlipidemia.  on Zocor. LDL up 81. Goal < 70. Will increase Zocor to 40 mg daily and repeat lab in 3 months.  Encourage good diet.   4. Palpitations/tachycardia.   Symptoms improved with increase in metoprolol.  5. 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 Will  plan on CT with  contrast after lab draw in 3 months.  Instructed to avoid heavy lifting/straining. Should avoid quinolone antibiotics.   Disposition: follow up in 6 months.  Signed, Kathlynn Swofford Martin[place], MD  07/04/2022 8:31 AM    [facility] [address], [place], [place], [address] Phone [phone], Fax [phone]

## 2022-07-04 ENCOUNTER — Ambulatory Visit: Payer: Medicare Other | Attending: Cardiology | Admitting: Cardiology

## 2022-07-04 ENCOUNTER — Telehealth: Payer: Self-pay

## 2022-07-04 ENCOUNTER — Encounter: Payer: Self-pay | Admitting: Cardiology

## 2022-07-04 VITALS — BP 112/66 | HR 70 | Ht 72.0 in | Wt 234.6 lb

## 2022-07-04 DIAGNOSIS — I10 Essential (primary) hypertension: Secondary | ICD-10-CM | POA: Diagnosis not present

## 2022-07-04 DIAGNOSIS — I25708 Atherosclerosis of coronary artery bypass graft(s), unspecified, with other forms of angina pectoris: Secondary | ICD-10-CM

## 2022-07-04 DIAGNOSIS — E7800 Pure hypercholesterolemia, unspecified: Secondary | ICD-10-CM

## 2022-07-04 DIAGNOSIS — I7121 Aneurysm of the ascending aorta, without rupture: Secondary | ICD-10-CM | POA: Diagnosis not present

## 2022-07-04 MED ORDER — SIMVASTATIN 40 MG PO TABS: 40.0000 mg | ORAL_TABLET | Freq: Every day | ORAL | 3 refills | Status: DC

## 2022-07-04 NOTE — Patient Instructions (Addendum)
Medication Instructions:   INCREASE Simvastatin to 40 mg daily    *If you need a refill on your cardiac medications before your next appointment, please call your pharmacy*  Lab Work: Your physician recommends that you return for lab work in 3 months:  CMP Fasting Lipid Panel-DO NOT eat or drink past midnight. Okay to have water  If you have labs (blood work) drawn today and your tests are completely normal, you will receive your results only by: [place] (if you have MyChart) OR A paper copy in the mail If you have any lab test that is abnormal or we need to change your treatment, we will call you to review the results.  Testing/Procedures: Non-Cardiac CT Angiography (CTA), is a special type of CT scan that uses a computer to produce multi-dimensional views of major blood vessels throughout the body. In CT angiography, a contrast material is injected through an IV to help visualize the blood vessels  Please schedule for 3 months   Follow-Up: At [facility], you and your health needs are our priority.  As part of our continuing mission to provide you with exceptional heart care, we have created designated Provider Care Teams.  These Care Teams include your primary Cardiologist (physician) and Advanced Practice Providers (APPs -  Physician Assistants and Nurse Practitioners) who all work together to provide you with the care you need, when you need it.   Your next appointment:   6 month(s)  Provider:   Peter Martinique, MD     Other Instructions

## 2022-07-04 NOTE — Telephone Encounter (Signed)
Open in error

## 2022-08-31 ENCOUNTER — Telehealth: Payer: Self-pay | Admitting: Cardiology

## 2022-08-31 NOTE — Telephone Encounter (Signed)
Spoke to patient Dr.Jordan's advice given.Stated palpitations seem different.Stated he would feel better if he could be seen.Appointment scheduled with Diona Browner NP 4/4 at 1:55 pm.

## 2022-08-31 NOTE — Telephone Encounter (Signed)
Patient c/o Palpitations:  High priority if patient c/o lightheadedness, shortness of breath, or chest pain  How long have you had palpitations/irregular HR/ Afib? Are you having the symptoms now? Palpitations and Irregular HR for about 3 weeks and yes  Are you currently experiencing lightheadedness, SOB or CP? Lightheadedness and discomfort in chest  Do you have a history of afib (atrial fibrillation) or irregular heart rhythm? No  Have you checked your BP or HR? (document readings if available): No  Are you experiencing any other symptoms? No

## 2022-08-31 NOTE — Telephone Encounter (Signed)
Patient states palpitations for about 3 weeks.  He states slight chest discomfort like a "fullness". Feels like he needs to belch constantly  Feels his heart "beats fast, skips a beat, beats slow, then skips a beat, then fast again. I can feel it all the time now." C/O lightheadedness same as last OV. No increase in swelling since last OV, wearing compression hose as directed.  Slight headache but also Left ear ache, taking Ibuprofen for this which resolves.  No BP or heart rate readings. He does state HR 44 while on the phone.  Taking metoprolol 50 mg 2 Times a day.  and Losartan 100 mg Daily. CT Angio 5/17 and OV 8/15. Please advise.

## 2022-09-01 ENCOUNTER — Encounter: Payer: Self-pay | Admitting: Nurse Practitioner

## 2022-09-01 ENCOUNTER — Ambulatory Visit (INDEPENDENT_AMBULATORY_CARE_PROVIDER_SITE_OTHER): Payer: Medicare Other

## 2022-09-01 ENCOUNTER — Ambulatory Visit: Payer: Medicare Other | Attending: Nurse Practitioner | Admitting: Nurse Practitioner

## 2022-09-01 VITALS — BP 112/62 | HR 69 | Ht 72.0 in | Wt 237.2 lb

## 2022-09-01 DIAGNOSIS — E785 Hyperlipidemia, unspecified: Secondary | ICD-10-CM | POA: Diagnosis not present

## 2022-09-01 DIAGNOSIS — R002 Palpitations: Secondary | ICD-10-CM | POA: Diagnosis not present

## 2022-09-01 DIAGNOSIS — I2510 Atherosclerotic heart disease of native coronary artery without angina pectoris: Secondary | ICD-10-CM

## 2022-09-01 DIAGNOSIS — I10 Essential (primary) hypertension: Secondary | ICD-10-CM | POA: Diagnosis not present

## 2022-09-01 DIAGNOSIS — I7121 Aneurysm of the ascending aorta, without rupture: Secondary | ICD-10-CM

## 2022-09-01 NOTE — Patient Instructions (Addendum)
Medication Instructions:  Your physician recommends that you continue on your current medications as directed. Please refer to the Current Medication list given to you today.  *If you need a refill on your cardiac medications before your next appointment, please call your pharmacy*   Lab Work: Your physician recommends that you return for lab work at your convenience. Magnesium (please complete other lab work needed)  If you have labs (blood work) drawn today and your tests are completely normal, you will receive your results only by: [place] (if you have MyChart) OR A paper copy in the mail If you have any lab test that is abnormal or we need to change your treatment, we will call you to review the results.   Testing/Procedures: ZIO AT Long term monitor   Your physician has requested you wear a ZIO patch monitor for 14 days.  This is a single patch monitor. [employer] supplies one patch monitor per enrollment. Additional  stickers are not available.  Please do not apply patch if you will be having a Nuclear Stress Test, Echocardiogram, Cardiac CT, MRI,  or Chest Xray during the period you would be wearing the monitor. The patch cannot be worn during  these tests. You cannot remove and re-apply the ZIO AT patch monitor.  Your ZIO patch monitor will be mailed 3 day USPS to your address on file. It may take 3-5 days to  receive your monitor after you have been enrolled.  Once you have received your monitor, please review the enclosed instructions. Your monitor has  already been registered assigning a specific monitor serial # to you.   Billing and Patient Assistance Program information  Theodore Demark has been supplied with any insurance information on record for billing. [employer] offers a sliding scale Patient Assistance Program for patients without insurance, or whose  insurance does not completely cover the cost of the ZIO patch monitor. You must apply for the  Patient Assistance  Program to qualify for the discounted rate. To apply, call [employer] at [phone],  select option 4, select option 2 , ask to apply for the Patient Assistance Program, (you can request an  interpreter if needed). [employer] will ask your household income and how many people are in your  household. [employer] will quote your out-of-pocket cost based on this information. They will also be able  to set up a 12 month interest free payment plan if needed.  Applying the monitor   Shave hair from upper left chest.  Hold the abrader disc by orange tab. Rub the abrader in 40 strokes over left upper chest as indicated in  your monitor instructions.  Clean area with 4 enclosed alcohol pads. Use all pads to ensure the area is cleaned thoroughly. Let  dry.  Apply patch as indicated in monitor instructions. Patch will be placed under collarbone on left side of  chest with arrow pointing upward.  Rub patch adhesive wings for 2 minutes. Remove the white label marked "1". Remove the white label  marked "2". Rub patch adhesive wings for 2 additional minutes.  While looking in a mirror, press and release button in center of patch. A small green light will flash 3-4  times. This will be your only indicator that the monitor has been turned on.  Do not shower for the first 24 hours. You may shower after the first 24 hours.  Press the button if you feel a symptom. You will hear a small click. Record Date, Time and Symptom in  the Patient Log.   Starting the Gateway  In your kit there is a Hydrographic surveyor box the size of a cellphone. This is Airline pilot. It transmits all your  recorded data to [facility]. This box must always stay within 10 feet of you. Open the box and push the *  button. There will be a light that blinks orange and then green a few times. When the light stops  blinking, the Gateway is connected to the ZIO patch. Call [employer] at [phone] to confirm your monitor is transmitting.  Returning your  monitor  Remove your patch and place it inside the [place]. In the lower half of the Gateway there is a white  bag with prepaid postage on it. Place Gateway in bag and seal. Mail package back to [place] as soon as  possible. Your physician should have your final report approximately 7 days after you have mailed back  your monitor. Call [place] at [phone] if you have questions regarding your ZIO AT  patch monitor. Call them immediately if you see an orange light blinking on your monitor.  If your monitor falls off in less than 4 days, contact our Monitor department at [phone]. If your  monitor becomes loose or falls off after 4 days call [employer] at [phone] for suggestions on  securing your monitor    Follow-Up: At [facility], you and your health needs are our priority.  As part of our continuing mission to provide you with exceptional heart care, we have created designated Provider Care Teams.  These Care Teams include your primary Cardiologist (physician) and Advanced Practice Providers (APPs -  Physician Assistants and Nurse Practitioners) who all work together to provide you with the care you need, when you need it.  We recommend signing up for the patient portal called "MyChart".  Sign up information is provided on this After Visit Summary.  MyChart is used to connect with patients for Virtual Visits (Telemedicine).  Patients are able to view lab/test results, encounter notes, upcoming appointments, etc.  Non-urgent messages can be sent to your provider as well.   To learn more about what you can do with MyChart, go to [email or web address].    Your next appointment:   6-8 weeks   Provider:   Diona Browner NP   Other Instructions

## 2022-09-01 NOTE — Progress Notes (Unsigned)
Enrolled patient for a 14 day Zio XT monitor to be mailed to patients home   Dr Jordan to read 

## 2022-09-01 NOTE — Progress Notes (Signed)
Office Visit    Patient Name: Kyle Baldwin Date of Encounter: 09/01/2022  Primary Care Provider:  [name], MD Primary Cardiologist:  Peter Martinique, MD  Chief Complaint    77 year old male with a history of CAD s/p CABG x 3 in 2007, POBA-OM1 in 2023, palpitations, tachycardia, hypertension, hyperlipidemia, ascending aortic aneurysm, and GERD who presents for follow-up related to palpitations.  Past Medical History    Past Medical History:  Diagnosis Date   Bilateral carpal tunnel syndrome    Bladder cancer 2005   bladder cancer in situ 2005   CAD (coronary artery disease)    Diverticulosis    Fatty liver    GERD (gastroesophageal reflux disease)    Hyperlipidemia    Hypertension    Kidney stones    Liver cyst right   hepatic lobe cyst   Myocardial infarction 2007   heart bypass 2007   Vitamin B 12 deficiency    Past Surgical History:  Procedure Laterality Date   BLADDER SURGERY  2005   CORONARY STENT INTERVENTION N/A 08/12/2021   Procedure: CORONARY STENT INTERVENTION;  Surgeon: Martinique, Peter M, MD;  Location: [place] [facility];  Service: Cardiovascular;  Laterality: N/A;   heart bypass  2007   3 vessels   LEFT HEART CATH AND CORS/GRAFTS ANGIOGRAPHY N/A 08/12/2021   Procedure: LEFT HEART CATH AND CORS/GRAFTS ANGIOGRAPHY;  Surgeon: Martinique, Peter M, MD;  Location: [place] [facility];  Service: Cardiovascular;  Laterality: N/A;   LUMBAR LAMINECTOMY      Allergies  Allergies  Allergen Reactions   Codeine Nausea And Vomiting   Morphine And Related Nausea And Vomiting   Lisinopril     Other reaction(s): Cough, sore throat     Labs/Other Studies Reviewed    The following studies were reviewed today:  Myoview 06/23/15: Nuclear stress EF: 51%. The left ventricular ejection fraction is mildly decreased (45-54%). There was no ST segment deviation noted during stress. No T wave inversion was noted during stress. Defect 1: There is a small defect of mild  severity present in the basal inferior location. This is a low risk study.   Low risk stress nuclear study with a small fixed basal inferior fixed defect (small scar versus diaphragmatic attenuation artifact). No reversible ischemia is seen. Borderline left ventricular global systolic function    Event monitor. 03/19/20: Study Highlights   Normal sinus rhythm Rare PACs and PVCs Few brief runs of SVT less than 6 beats.     Echo 07/27/21:   IMPRESSIONS     1. Left ventricular ejection fraction, by estimation, is 60 to 65%. The  left ventricle has normal function. The left ventricle has no regional  wall motion abnormalities. Left ventricular diastolic parameters are  consistent with Grade I diastolic  dysfunction (impaired relaxation). The average left ventricular global  longitudinal strain is -24.8 %. The global longitudinal strain is normal.   2. Right ventricular systolic function was not well visualized. The right  ventricular size is normal. Tricuspid regurgitation signal is inadequate  for assessing PA pressure.   3. The mitral valve is grossly normal. No evidence of mitral valve  regurgitation.   4. The aortic valve is grossly normal. There is mild calcification of the  aortic valve. Aortic valve regurgitation is not visualized.   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   6. The inferior vena cava is normal in size with greater than 50%  respiratory variability, suggesting right atrial pressure of  3 mmHg.   Conclusion(s)/Recommendation(s): Otherwise normal echocardiogram, with  minor abnormalities described in the report. Recommend annual surveillance  for aortic root aneurysm.    Cardiac cath 08/12/21:    Dist RCA lesion is 100% stenosed.   Mid LAD lesion is 80% stenosed.   Mid LAD to Dist LAD lesion is 90% stenosed.   1st Diag lesion is 60% stenosed.   1st Mrg lesion is 90% stenosed.   Balloon angioplasty was performed using a BALLN SAPPHIRE 2.5X12.   Post  intervention, there is a 25% residual stenosis.   SVG graft was visualized by angiography and is normal in caliber.   LIMA graft was visualized by angiography and is normal in caliber.   The graft exhibits no disease.   The graft exhibits no disease.   The left ventricular systolic function is normal.   LV end diastolic pressure is normal.   The left ventricular ejection fraction is 55-65% by visual estimate.   3 vessel obstructive CAD      - diffuse 90 % mid to distal LAD. 65% proximal first diagonal      - 90% first OM      - 100% distal RCA 2.   Patent LIMA to the LAD 3.   Patent SVG to PDA/PL 4.   Normal LV function 5.   Normal LVEDP 6.   Successful POBA of the first OM. Unable to deliver stent due to vessel tortuosity.     Plan: would continue DAPT with ASA and Plavix for one month. Monitor clinically. If patient has restenosis of the OM would need to consider more aggressive approach from femoral artery. Would start with a 4 cm guide and consider heavy duty wire. Holly Bodily support offered little assistance.    Recent Labs: 12/24/2021: ALT 15; BUN 23; Creatinine, Ser 1.35; Potassium 4.9; Sodium 142  Recent Lipid Panel    Component Value Date/Time   CHOL 127 12/24/2021 0920   TRIG 81 12/24/2021 0920   HDL 36 (L) 12/24/2021 0920   CHOLHDL 3.5 12/24/2021 0920   LDLCALC 75 12/24/2021 0920    History of Present Illness    77 year old male with the above past medical history including CAD s/p CABG x 3 in 2007, POBA-OM1 in 2023, palpitations, tachycardia, hypertension, hyperlipidemia, ascending aortic aneurysm, and GERD.  Cardiac event monitor in the setting of palpitations following COVID-19 in 2021 showed rare PACs, PVCs, 4 brief runs of SVT.  Repeat cardiac catheterization in 07/2021 in the setting of chest pressure showed patent grafts to LAD and RCA, significant stenosis in OM1.  This was treated with balloon angioplasty, unable to deliver a stent due to significant  angulation and tortuosity of vessel.  Echocardiogram at the time showed normal LV function, ascending aortic aneurysm measuring 4.4 cm.   He was last seen in the office on 07/04/2022 and was stable overall from a cardiac standpoint.  He did note some epigastric pain after eating.  He noted some swelling and numbness in his left leg.  CT angio of the chest aorta was scheduled for 09/2022 to follow-up ascending aortic aneurysm.  He contacted our office on 08/31/2022 and noted a 3-week history of palpitations, lightheadedness and chest discomfort.  He presents today for follow-up.  Since his last visit and since he contacted our office he notes a 3-week history of daily intermittent palpitations which she describes as a skipping or fluttering in his chest with associated dizziness, fullness in his head. He denies presyncope, syncope.  He  has also noted some intermittent mild chest pressure, not associated with palpitations, that occurs both at rest and with activity, he notes this is overall stable from prior visits.  Home Medications    Current Outpatient Medications  Medication Sig Dispense Refill   aspirin EC 81 MG tablet Take 81 mg by mouth daily.     cyanocobalamin 1000 MCG tablet Take 1,000 mcg by mouth daily.      losartan (COZAAR) 100 MG tablet Take 100 mg by mouth daily.      metoprolol tartrate (LOPRESSOR) 50 MG tablet Take 1 tablet (50 mg total) by mouth 2 (two) times daily. 180 tablet 3   nitroGLYCERIN (NITROSTAT) 0.4 MG SL tablet Place 1 tablet (0.4 mg total) under the tongue every 5 (five) minutes as needed for chest pain. 25 tablet 6   omeprazole (PRILOSEC OTC) 20 MG tablet Take one tablet by mouth once daily Oral     Polyethyl Glycol-Propyl Glycol (SYSTANE) 0.4-0.3 % SOLN Place 1 drop into both eyes daily as needed (Dry eye).     simvastatin (ZOCOR) 40 MG tablet Take 1 tablet (40 mg total) by mouth at bedtime. 90 tablet 3   sodium chloride (OCEAN) 0.65 % SOLN nasal spray Place 1 spray into  both nostrils at bedtime.     Vitamin D, Ergocalciferol, (DRISDOL) 1.25 MG (50000 UNIT) CAPS capsule Take 50,000 Units by mouth once a week.     No current facility-administered medications for this visit.     Review of Systems   He denies dyspnea, pnd, orthopnea, n, v, dizziness, syncope, edema, weight gain, or early satiety. All other systems reviewed and are otherwise negative except as noted above.   Physical Exam    VS:  BP 112/62 (BP Location: Left Arm, Patient Position: Sitting, Cuff Size: Normal)   Pulse 69   Ht 6' (1.829 m)   Wt 237 lb 3.2 oz (107.6 kg)   SpO2 97%   BMI 32.17 kg/m   GEN: Well nourished, well developed, in no acute distress. HEENT: normal. Neck: Supple, no JVD, carotid bruits, or masses. Cardiac: RRR, no murmurs, rubs, or gallops. No clubbing, cyanosis, edema.  Radials/DP/PT 2+ and equal bilaterally.  Respiratory:  Respirations regular and unlabored, clear to auscultation bilaterally. GI: Soft, nontender, nondistended, BS + x 4. MS: no deformity or atrophy. Skin: warm and dry, no rash. Neuro:  Strength and sensation are intact. Psych: Normal affect.  Accessory Clinical Findings    ECG personally reviewed by me today -sinus rhythm, 69 bpm, PACs- no acute changes.   Lab Results  Component Value Date   WBC 5.7 07/29/2021   HGB 14.2 07/29/2021   HCT 42.2 07/29/2021   MCV 89 07/29/2021   PLT 181 07/29/2021   Lab Results  Component Value Date   CREATININE 1.35 (H) 12/24/2021   BUN 23 12/24/2021   NA 142 12/24/2021   K 4.9 12/24/2021   CL 105 12/24/2021   CO2 26 12/24/2021   Lab Results  Component Value Date   ALT 15 12/24/2021   AST 13 12/24/2021   ALKPHOS 59 12/24/2021   BILITOT 1.0 12/24/2021   Lab Results  Component Value Date   CHOL 127 12/24/2021   HDL 36 (L) 12/24/2021   LDLCALC 75 12/24/2021   TRIG 81 12/24/2021   CHOLHDL 3.5 12/24/2021    No results found for: "HGBA1C"  Assessment & Plan   1. Palpitations: He notes a  3-week history of daily intermittent palpitations with associated dizziness,  fullness in his head.  He describes his palpitations as a skipping or fluttering.  EKG today shows sinus rhythm with PACs.  Will check 14-day ZIO.  Will check Mg, CMET.  It was noted by Dr. Martinique that he would not likely tolerate an increased dose of metoprolol. Discussed ED precautions. Continue metoprolol.  2. CAD: S/p CABG x 3 in 2007, POBA-OM1 in 2023.  He notes stable mild chest history that occurs both at rest and with activity.  We discussed possible addition of Imdur, however, patient declines as he states his symptoms of chest comfort are very mild.  Again, discussed ED precautions.  Continue aspirin, losartan, metoprolol, simvastatin.  3. Hypertension: BP well controlled. Continue current antihypertensive regimen.   4. Hyperlipidemia: LDL was 81 in 12/2021.  Will repeat fasting lipid panel.  Continue simvastatin.  5. Ascending aortic aneurysm: Measured 4.4 cm on most recent echocardiogram.  CT angio chest aorta scheduled for 09/2022.  6. Disposition: Follow-up in 6 to 8 weeks.  Lenna Sciara, NP 09/01/2022, 1:46 PM

## 2022-09-05 DIAGNOSIS — R002 Palpitations: Secondary | ICD-10-CM | POA: Diagnosis not present

## 2022-09-27 ENCOUNTER — Telehealth: Payer: Self-pay | Admitting: Nurse Practitioner

## 2022-09-27 NOTE — Telephone Encounter (Signed)
[employer] called with abnormal ZIO patch results.  This has already been finalized and in patient chart.   Symptomatic bradycardia  Hr 35 bpm lasting for 30 seconds.  Found on page 15-16 strip #qq

## 2022-09-27 NOTE — Telephone Encounter (Signed)
Olivia from [employer] with critical results

## 2022-09-28 NOTE — Telephone Encounter (Signed)
Results already reviewed. No additional action needed  Peter [place] MD, [facility]

## 2022-09-30 ENCOUNTER — Other Ambulatory Visit: Payer: Self-pay

## 2022-09-30 DIAGNOSIS — E7800 Pure hypercholesterolemia, unspecified: Secondary | ICD-10-CM

## 2022-09-30 LAB — LIPID PANEL
Chol/HDL Ratio: 3.6 ratio (ref 0.0–5.0)
Cholesterol, Total: 120 mg/dL (ref 100–199)
HDL: 33 mg/dL — ABNORMAL LOW (ref >39)
LDL Chol Calc (NIH): 67 mg/dL (ref 0–99)
Triglycerides: 105 mg/dL (ref 0–149)
VLDL Cholesterol Cal: 20 mg/dL (ref 5–40)

## 2022-09-30 LAB — COMPREHENSIVE METABOLIC PANEL
ALT: 12 IU/L (ref 0–44)
AST: 18 IU/L (ref 0–40)
Albumin/Globulin Ratio: 2 (ref 1.2–2.2)
Albumin: 4.1 g/dL (ref 3.8–4.8)
Alkaline Phosphatase: 61 IU/L (ref 44–121)
BUN/Creatinine Ratio: 23 (ref 10–24)
BUN: 29 mg/dL — ABNORMAL HIGH (ref 8–27)
Bilirubin Total: 1.1 mg/dL (ref 0.0–1.2)
CO2: 25 mmol/L (ref 20–29)
Calcium: 8.9 mg/dL (ref 8.6–10.2)
Chloride: 105 mmol/L (ref 96–106)
Creatinine, Ser: 1.28 mg/dL — ABNORMAL HIGH (ref 0.76–1.27)
Globulin, Total: 2.1 g/dL (ref 1.5–4.5)
Glucose: 98 mg/dL (ref 70–99)
Potassium: 4.3 mmol/L (ref 3.5–5.2)
Sodium: 142 mmol/L (ref 134–144)
Total Protein: 6.2 g/dL (ref 6.0–8.5)
eGFR: 58 mL/min/{1.73_m2} — ABNORMAL LOW (ref >59)

## 2022-09-30 LAB — MAGNESIUM: Magnesium: 2.1 mg/dL (ref 1.6–2.3)

## 2022-10-06 ENCOUNTER — Telehealth: Payer: Self-pay

## 2022-10-06 NOTE — Telephone Encounter (Signed)
Spoke with pt. Pt was notified of lab results. Pt will continue current medication and f/u as planned.  

## 2022-10-13 ENCOUNTER — Ambulatory Visit: Payer: Medicare Other | Admitting: Nurse Practitioner

## 2022-10-14 ENCOUNTER — Inpatient Hospital Stay (HOSPITAL_COMMUNITY)
Admission: EM | Admit: 2022-10-14 | Discharge: 2022-10-16 | DRG: 176 | Disposition: A | Discharge status: Home / Self Care | Payer: Medicare Other | Attending: Internal Medicine | Admitting: Internal Medicine

## 2022-10-14 ENCOUNTER — Encounter (HOSPITAL_COMMUNITY): Payer: Self-pay | Admitting: Internal Medicine

## 2022-10-14 ENCOUNTER — Inpatient Hospital Stay (HOSPITAL_COMMUNITY): Payer: Medicare Other

## 2022-10-14 ENCOUNTER — Ambulatory Visit (HOSPITAL_COMMUNITY)
Admission: RE | Admit: 2022-10-14 | Discharge: 2022-10-14 | Disposition: A | Discharge status: Home / Self Care | Payer: Medicare Other | Source: Ambulatory Visit | Attending: Cardiology | Admitting: Cardiology

## 2022-10-14 ENCOUNTER — Emergency Department (HOSPITAL_COMMUNITY): Payer: Medicare Other

## 2022-10-14 ENCOUNTER — Telehealth: Payer: Self-pay | Admitting: Cardiology

## 2022-10-14 ENCOUNTER — Other Ambulatory Visit: Payer: Self-pay

## 2022-10-14 DIAGNOSIS — I2609 Other pulmonary embolism with acute cor pulmonale: Secondary | ICD-10-CM | POA: Diagnosis not present

## 2022-10-14 DIAGNOSIS — Z833 Family history of diabetes mellitus: Secondary | ICD-10-CM | POA: Diagnosis not present

## 2022-10-14 DIAGNOSIS — Z888 Allergy status to other drugs, medicaments and biological substances status: Secondary | ICD-10-CM | POA: Diagnosis not present

## 2022-10-14 DIAGNOSIS — I2510 Atherosclerotic heart disease of native coronary artery without angina pectoris: Secondary | ICD-10-CM | POA: Diagnosis present

## 2022-10-14 DIAGNOSIS — Z7982 Long term (current) use of aspirin: Secondary | ICD-10-CM | POA: Diagnosis not present

## 2022-10-14 DIAGNOSIS — I10 Essential (primary) hypertension: Secondary | ICD-10-CM | POA: Diagnosis present

## 2022-10-14 DIAGNOSIS — Z8551 Personal history of malignant neoplasm of bladder: Secondary | ICD-10-CM

## 2022-10-14 DIAGNOSIS — E785 Hyperlipidemia, unspecified: Secondary | ICD-10-CM | POA: Diagnosis present

## 2022-10-14 DIAGNOSIS — K219 Gastro-esophageal reflux disease without esophagitis: Secondary | ICD-10-CM | POA: Diagnosis present

## 2022-10-14 DIAGNOSIS — Z86711 Personal history of pulmonary embolism: Secondary | ICD-10-CM | POA: Diagnosis not present

## 2022-10-14 DIAGNOSIS — I2693 Single subsegmental pulmonary embolism without acute cor pulmonale: Secondary | ICD-10-CM | POA: Diagnosis present

## 2022-10-14 DIAGNOSIS — N1831 Chronic kidney disease, stage 3a: Secondary | ICD-10-CM | POA: Diagnosis present

## 2022-10-14 DIAGNOSIS — Z79899 Other long term (current) drug therapy: Secondary | ICD-10-CM

## 2022-10-14 DIAGNOSIS — I129 Hypertensive chronic kidney disease with stage 1 through stage 4 chronic kidney disease, or unspecified chronic kidney disease: Secondary | ICD-10-CM | POA: Diagnosis present

## 2022-10-14 DIAGNOSIS — I82412 Acute embolism and thrombosis of left femoral vein: Secondary | ICD-10-CM | POA: Diagnosis present

## 2022-10-14 DIAGNOSIS — M7989 Other specified soft tissue disorders: Secondary | ICD-10-CM | POA: Diagnosis present

## 2022-10-14 DIAGNOSIS — Z86008 Personal history of in-situ neoplasm of other site: Secondary | ICD-10-CM | POA: Diagnosis not present

## 2022-10-14 DIAGNOSIS — I2699 Other pulmonary embolism without acute cor pulmonale: Secondary | ICD-10-CM | POA: Diagnosis present

## 2022-10-14 DIAGNOSIS — I7121 Aneurysm of the ascending aorta, without rupture: Secondary | ICD-10-CM

## 2022-10-14 DIAGNOSIS — I252 Old myocardial infarction: Secondary | ICD-10-CM

## 2022-10-14 DIAGNOSIS — Z8249 Family history of ischemic heart disease and other diseases of the circulatory system: Secondary | ICD-10-CM

## 2022-10-14 DIAGNOSIS — Z955 Presence of coronary angioplasty implant and graft: Secondary | ICD-10-CM | POA: Diagnosis not present

## 2022-10-14 DIAGNOSIS — Z885 Allergy status to narcotic agent status: Secondary | ICD-10-CM | POA: Diagnosis not present

## 2022-10-14 DIAGNOSIS — I25810 Atherosclerosis of coronary artery bypass graft(s) without angina pectoris: Secondary | ICD-10-CM | POA: Diagnosis present

## 2022-10-14 DIAGNOSIS — Z87891 Personal history of nicotine dependence: Secondary | ICD-10-CM | POA: Diagnosis not present

## 2022-10-14 DIAGNOSIS — E538 Deficiency of other specified B group vitamins: Secondary | ICD-10-CM | POA: Diagnosis present

## 2022-10-14 DIAGNOSIS — I2489 Other forms of acute ischemic heart disease: Secondary | ICD-10-CM | POA: Diagnosis present

## 2022-10-14 LAB — ECHOCARDIOGRAM COMPLETE
AR max vel: 2.65 cm2
AV Area VTI: 3.13 cm2
AV Area mean vel: 3.06 cm2
AV Mean grad: 4 mmHg
AV Peak grad: 7.4 mmHg
Ao pk vel: 1.36 m/s
Area-P 1/2: 1.83 cm2
Height: 72 in
S' Lateral: 2.8 cm
Weight: 3774.28 oz

## 2022-10-14 LAB — BASIC METABOLIC PANEL
Anion gap: 7 (ref 5–15)
BUN: 21 mg/dL (ref 8–23)
CO2: 23 mmol/L (ref 22–32)
Calcium: 8.8 mg/dL — ABNORMAL LOW (ref 8.9–10.3)
Chloride: 110 mmol/L (ref 98–111)
Creatinine, Ser: 1.26 mg/dL — ABNORMAL HIGH (ref 0.61–1.24)
GFR, Estimated: 59 mL/min — ABNORMAL LOW (ref >60)
Glucose, Bld: 154 mg/dL — ABNORMAL HIGH (ref 70–99)
Potassium: 3.8 mmol/L (ref 3.5–5.1)
Sodium: 140 mmol/L (ref 135–145)

## 2022-10-14 LAB — TROPONIN I (HIGH SENSITIVITY)
Troponin I (High Sensitivity): 79 ng/L — ABNORMAL HIGH (ref <18)
Troponin I (High Sensitivity): 77 ng/L — ABNORMAL HIGH (ref <18)

## 2022-10-14 LAB — CBC
HCT: 43 % (ref 39.0–52.0)
Hemoglobin: 14.5 g/dL (ref 13.0–17.0)
MCH: 30.5 pg (ref 26.0–34.0)
MCHC: 33.7 g/dL (ref 30.0–36.0)
MCV: 90.3 fL (ref 80.0–100.0)
Platelets: 179 10*3/uL (ref 150–400)
RBC: 4.76 MIL/uL (ref 4.22–5.81)
RDW: 13.7 % (ref 11.5–15.5)
WBC: 6.9 10*3/uL (ref 4.0–10.5)
nRBC: 0 % (ref 0.0–0.2)

## 2022-10-14 LAB — BRAIN NATRIURETIC PEPTIDE: B Natriuretic Peptide: 142.2 pg/mL — ABNORMAL HIGH (ref 0.0–100.0)

## 2022-10-14 LAB — LACTIC ACID, PLASMA: Lactic Acid, Venous: 1 mmol/L (ref 0.5–1.9)

## 2022-10-14 LAB — HEPARIN LEVEL (UNFRACTIONATED): Heparin Unfractionated: 0.65 IU/mL (ref 0.30–0.70)

## 2022-10-14 MED ORDER — POLYETHYL GLYCOL-PROPYL GLYCOL 0.4-0.3 % OP SOLN: 1.0000 [drp] | Freq: Every day | OPHTHALMIC | Status: DC | PRN

## 2022-10-14 MED ORDER — HEPARIN (PORCINE) 25000 UT/250ML-% IV SOLN
1500.0000 [IU]/h | INTRAVENOUS | Status: AC
  Administered 2022-10-14 – 2022-10-15 (×2): 1600 [IU]/h via INTRAVENOUS
  Filled 2022-10-14 (×2): qty 250

## 2022-10-14 MED ORDER — SIMVASTATIN 20 MG PO TABS
40.0000 mg | ORAL_TABLET | Freq: Every day | ORAL | Status: DC
  Administered 2022-10-14 – 2022-10-15 (×2): 40 mg via ORAL
  Filled 2022-10-14 (×2): qty 2

## 2022-10-14 MED ORDER — PERFLUTREN LIPID MICROSPHERE
1.0000 mL | INTRAVENOUS | Status: AC | PRN
  Administered 2022-10-14: 2 mL via INTRAVENOUS

## 2022-10-14 MED ORDER — IOHEXOL 350 MG/ML SOLN
50.0000 mL | Freq: Once | INTRAVENOUS | Status: AC | PRN
  Administered 2022-10-14: 50 mL via INTRAVENOUS

## 2022-10-14 MED ORDER — METOPROLOL TARTRATE 50 MG PO TABS
50.0000 mg | ORAL_TABLET | Freq: Two times a day (BID) | ORAL | Status: DC
  Administered 2022-10-14 – 2022-10-15 (×3): 50 mg via ORAL
  Filled 2022-10-14 (×3): qty 1

## 2022-10-14 MED ORDER — PANTOPRAZOLE SODIUM 40 MG PO TBEC
40.0000 mg | DELAYED_RELEASE_TABLET | Freq: Two times a day (BID) | ORAL | Status: DC
  Administered 2022-10-14 – 2022-10-15 (×3): 40 mg via ORAL
  Filled 2022-10-14 (×3): qty 1

## 2022-10-14 MED ORDER — OMEPRAZOLE MAGNESIUM 20 MG PO TBEC: 20.0000 mg | DELAYED_RELEASE_TABLET | Freq: Two times a day (BID) | ORAL | Status: DC

## 2022-10-14 MED ORDER — LOSARTAN POTASSIUM 50 MG PO TABS
100.0000 mg | ORAL_TABLET | Freq: Every day | ORAL | Status: DC
  Administered 2022-10-15: 100 mg via ORAL
  Filled 2022-10-14 (×2): qty 2

## 2022-10-14 MED ORDER — HEPARIN BOLUS VIA INFUSION
5000.0000 [IU] | Freq: Once | INTRAVENOUS | Status: AC
  Administered 2022-10-14: 5000 [IU] via INTRAVENOUS
  Filled 2022-10-14: qty 5000

## 2022-10-14 MED ORDER — ASPIRIN 81 MG PO TBEC
81.0000 mg | DELAYED_RELEASE_TABLET | Freq: Every day | ORAL | Status: DC
  Administered 2022-10-15: 81 mg via ORAL
  Filled 2022-10-14: qty 1

## 2022-10-14 NOTE — H&P (Signed)
History and Physical    Kyle Baldwin ZOX:[id number] DOB: Feb 07, 1946 DOA: 10/14/2022  PCP: Garlan Fillers, MD (Confirm with patient/family/NH records and if not entered, this has to be entered at [facility] point of entry) Patient coming from: Home I have personally briefly reviewed patient's old medical records in [facility]  Chief Complaint: Palpitations  HPI: Kyle Baldwin is a 77 y.o. male with medical history significant of CAD status post CABG, HTN, HLD, sent from cardiologist office for evaluation of acute PE.  Patient started to have frequent episode of palpitations about 2 weeks ago and went to see cardiology.  At that time, patient was put on a Holter monitoring for 2 weeks however the Holter monitor did not show significant event to explain patient's symptoms.  As patient continues to have palpitation episodes he went back to see cardiology today.  Cardiology ordered CTA to rule out aortic aneurysm, however CTA showed right lower branch PE acute.  As a result cardiology sent patient to ED for further workup and treatment.  Patient also noticed a chronic left calf swelling for 1+ year denies any recent surgery no recent international traveling history.  ED Course: Afebrile, no tachycardia blood pressure stable and not hypoxic. Patient was started on heparin drip.  Review of Systems: As per HPI otherwise 14 point review of systems negative.    Past Medical History:  Diagnosis Date   Bilateral carpal tunnel syndrome    Bladder cancer (HCC) 2005   bladder cancer in situ 2005   CAD (coronary artery disease)    Diverticulosis    Fatty liver    GERD (gastroesophageal reflux disease)    Hyperlipidemia    Hypertension    Kidney stones    Liver cyst right   hepatic lobe cyst   Myocardial infarction [facility]) 2007   heart bypass 2007   Vitamin B 12 deficiency     Past Surgical History:  Procedure Laterality Date   BLADDER SURGERY  2005   CORONARY STENT INTERVENTION N/A  08/12/2021   Procedure: CORONARY STENT INTERVENTION;  Surgeon: Swaziland, Peter M, MD;  Location: [facility] INVASIVE CV LAB;  Service: Cardiovascular;  Laterality: N/A;   heart bypass  2007   3 vessels   LEFT HEART CATH AND CORS/GRAFTS ANGIOGRAPHY N/A 08/12/2021   Procedure: LEFT HEART CATH AND CORS/GRAFTS ANGIOGRAPHY;  Surgeon: [place]il[place], Peter M, MD;  Location: [facility] INVASIVE CV LAB;  Service: Cardiovascular;  Laterality: N/A;   LUMBAR LAMINECTOMY       reports that he quit smoking about 54 years ago. His smoking use included cigarettes. He has never used smokeless tobacco. He reports that he does not drink alcohol and does not use drugs.  Allergies  Allergen Reactions   Codeine Nausea And Vomiting   Morphine And Codeine Nausea And Vomiting   Lisinopril     Other reaction(s): Cough, sore throat    Family History  Problem Relation Age of Onset   Colon cancer Maternal Grandfather 33   Diabetes Maternal Grandmother    Heart disease Maternal Grandmother    Stomach cancer Neg Hx      Prior to Admission medications   Medication Sig Start Date End Date Taking? Authorizing Provider  aspirin EC 81 MG tablet Take 81 mg by mouth daily.    [provider]  cyanocobalamin 1000 MCG tablet Take 1,000 mcg by mouth daily.     [provider]  losartan (COZAAR) 100 MG tablet Take 100 mg by mouth daily.  02/05/12   [provider]  metoprolol tartrate (LOPRESSOR) 50 MG tablet Take 1 tablet (50 mg total) by mouth 2 (two) times daily. 07/15/21 09/01/22  [place], Peter M, MD  nitroGLYCERIN (NITROSTAT) 0.4 MG SL tablet Place 1 tablet (0.4 mg total) under the tongue every 5 (five) minutes as needed for chest pain. 08/30/21   [place], Peter M, MD  omeprazole (PRILOSEC OTC) 20 MG tablet Take one tablet by mouth once daily Oral 01/19/12   [provider]  Polyethyl Glycol-Propyl Glycol (SYSTANE) 0.4-0.3 % SOLN Place 1 drop into both eyes daily as needed (Dry eye).    [provider]   simvastatin (ZOCOR) 40 MG tablet Take 1 tablet (40 mg total) by mouth at bedtime. 07/04/22 06/29/23  [place], Peter M, MD  sodium chloride (OCEAN) 0.65 % SOLN nasal spray Place 1 spray into both nostrils at bedtime.    [provider]  Vitamin D, Ergocalciferol, (DRISDOL) 1.25 MG (50000 UNIT) CAPS capsule Take 50,000 Units by mouth once a week. 06/22/21   [provider]    Physical Exam: Vitals:   10/14/22 1131  BP: 122/65  Pulse: 71  Resp: 20  Temp: 98.8 F (37.1 C)  SpO2: 98%  Weight: 107 kg  Height: 6' (1.829 m)    Constitutional: NAD, calm, comfortable Vitals:   10/14/22 1131  BP: 122/65  Pulse: 71  Resp: 20  Temp: 98.8 F (37.1 C)  SpO2: 98%  Weight: 107 kg  Height: 6' (1.829 m)   Eyes: PERRL, lids and conjunctivae normal ENMT: Mucous membranes are moist. Posterior pharynx clear of any exudate or lesions.Normal dentition.  Neck: normal, supple, no masses, no thyromegaly Respiratory: clear to auscultation bilaterally, no wheezing, no crackles. Normal respiratory effort. No accessory muscle use.  Cardiovascular: Regular rate and rhythm, no murmurs / rubs / gallops. No extremity edema. 2+ pedal pulses. No carotid bruits.  Abdomen: no tenderness, no masses palpated. No hepatosplenomegaly. Bowel sounds positive.  Musculoskeletal: no clubbing / cyanosis. No joint deformity upper and lower extremities. Good ROM, no contractures. Normal muscle tone.  Skin: no rashes, lesions, ulcers. No induration Neurologic: CN 2-12 grossly intact. Sensation intact, DTR normal. Strength 5/5 in all 4.  Psychiatric: Normal judgment and insight. Alert and oriented x 3. Normal mood.    Labs on Admission: I have personally reviewed following labs and imaging studies  CBC: Recent Labs  Lab 10/14/22 1218  WBC 6.9  HGB 14.5  HCT 43.0  MCV 90.3  PLT 179   Basic Metabolic Panel: No results for input(s): "NA", "K", "CL", "CO2", "GLUCOSE", "BUN", "CREATININE", "CALCIUM", "MG",  "PHOS" in the last 168 hours. GFR: Estimated Creatinine Clearance: 63.1 mL/min (A) (by C-G formula based on SCr of 1.28 mg/dL (H)). Liver Function Tests: No results for input(s): "AST", "ALT", "ALKPHOS", "BILITOT", "PROT", "ALBUMIN" in the last 168 hours. No results for input(s): "LIPASE", "AMYLASE" in the last 168 hours. No results for input(s): "AMMONIA" in the last 168 hours. Coagulation Profile: No results for input(s): "INR", "PROTIME" in the last 168 hours. Cardiac Enzymes: No results for input(s): "CKTOTAL", "CKMB", "CKMBINDEX", "TROPONINI" in the last 168 hours. BNP (last 3 results) No results for input(s): "PROBNP" in the last 8760 hours. HbA1C: No results for input(s): "HGBA1C" in the last 72 hours. CBG: No results for input(s): "GLUCAP" in the last 168 hours. Lipid Profile: No results for input(s): "CHOL", "HDL", "LDLCALC", "TRIG", "CHOLHDL", "LDLDIRECT" in the last 72 hours. Thyroid Function Tests: No results for input(s): "TSH", "  T4TOTAL", "FREET4", "T3FREE", "THYROIDAB" in the last 72 hours. Anemia Panel: No results for input(s): "VITAMINB12", "FOLATE", "FERRITIN", "TIBC", "IRON", "RETICCTPCT" in the last 72 hours. Urine analysis:    Component Value Date/Time   COLORURINE YELLOW 01/10/2007 0715   APPEARANCEUR CLEAR 01/10/2007 0715   LABSPEC 1.025 01/10/2007 0715   PHURINE 6.0 01/10/2007 0715   GLUCOSEU NEGATIVE 01/10/2007 0715   HGBUR NEGATIVE 01/10/2007 0715   BILIRUBINUR NEGATIVE 01/10/2007 0715   KETONESUR NEGATIVE 01/10/2007 0715   PROTEINUR NEGATIVE 01/10/2007 0715   UROBILINOGEN 0.2 01/10/2007 0715   NITRITE NEGATIVE 01/10/2007 0715   LEUKOCYTESUR  01/10/2007 0715    NEGATIVE MICROSCOPIC NOT DONE ON URINES WITH NEGATIVE PROTEIN, BLOOD, LEUKOCYTES, NITRITE, OR GLUCOSE <1000 mg/dL.    Radiological Exams on Admission: DG Chest 2 View  Result Date: 10/14/2022 CLINICAL DATA:  Shortness of breath. EXAM: CHEST - 2 VIEW COMPARISON:  January 27, 2019. FINDINGS:  The heart size and mediastinal contours are within normal limits. Status post coronary artery bypass graft. Both lungs are clear. The visualized skeletal structures are unremarkable. IMPRESSION: No acute radiographic cardiopulmonary disease. Pulmonary embolus is noted on CT scan of same day. Electronically Signed   By: Lupita Raider M.D.   On: 10/14/2022 12:02   CT ANGIO CHEST AORTA W/CM & OR WO/CM  Addendum Date: 10/14/2022   ADDENDUM REPORT: 10/14/2022 10:11 ADDENDUM: Critical Value/emergent results were called by telephone at the time of interpretation on 10/14/2022 at 10:11 am to provider Bryan Lemma, who verbally acknowledged these results. Electronically Signed   By: Lupita Raider M.D.   On: 10/14/2022 10:11   Result Date: 10/14/2022 CLINICAL DATA:  Aortic aneurysm. EXAM: CT ANGIOGRAPHY CHEST WITH CONTRAST TECHNIQUE: Multidetector CT imaging of the chest was performed using the standard protocol during bolus administration of intravenous contrast. Multiplanar CT image reconstructions and MIPs were obtained to evaluate the vascular anatomy. RADIATION DOSE REDUCTION: This exam was performed according to the departmental dose-optimization program which includes automated exposure control, adjustment of the mA and/or kV according to patient size and/or use of iterative reconstruction technique. CONTRAST:  50mL OMNIPAQUE IOHEXOL 350 MG/ML SOLN COMPARISON:  None Available. FINDINGS: Cardiovascular: Status post coronary artery bypass graft. There is no evidence of thoracic aortic aneurysm or dissection. Normal cardiac size. No pericardial effusion. Filling defect is noted in lower lobe branch of right pulmonary artery consistent with acute pulmonary embolus. RV/LV ratio of 1.5 is noted suggesting right heart strain. Mediastinum/Nodes: No enlarged mediastinal, hilar, or axillary lymph nodes. Thyroid gland, trachea, and esophagus demonstrate no significant findings. Lungs/Pleura: Lungs are clear. No pleural  effusion or pneumothorax. Upper Abdomen: No acute abnormality. Musculoskeletal: No chest wall abnormality. No acute or significant osseous findings. Review of the MIP images confirms the above findings. IMPRESSION: Moderate sized filling defect is noted in lower lobe branch of right pulmonary artery consistent with acute pulmonary embolus. Positive for acute PE with CT evidence of right heart strain (RV/LV Ratio = 1.5) consistent with at least submassive (intermediate risk) PE. The presence of right heart strain has been associated with an increased risk of morbidity and mortality. Please refer to the "Code PE Focused" order set in EPIC. No evidence of thoracic aortic aneurysm or dissection. Status post coronary artery bypass graft. Electronically Signed: By: Lupita Raider M.D. On: 10/14/2022 09:59    EKG: Independently reviewed.  Sinus rhythm, with frequent PVCs.  Assessment/Plan Principal Problem:   Pulmonary embolism (HCC) Active Problems:   CAD (coronary artery disease)  of artery bypass graft   HTN (hypertension)   Acute pulmonary embolism (HCC)  (please populate well all problems here in Problem List. (For example, if patient is on BP meds at home and you resume or decide to hold them, it is a problem that needs to be her. Same for CAD, COPD, HLD and so on)  Acute right lung lower lobe branch PE -Unprovoked, check DVT study.  Continue heparin drip for now.  If H&H remained stable, expect patient switched to Eliquis tomorrow -CTA showed evidence of increased RV/LV ratio, PCCM consulted and stat echo is being done. Given the location and size of the EP, do not expect additional intervention -Outpatient hematology follow-up for hypercoagulable state workup  HTN -Able, continue losartan  Palpitations -Probably related to frequent PVCs which can potentially linked to PE. Patient on metoprolol  CAD -No chest pains, EKG showed no acute ST changes, troponin level pending -Continue ASA and  Statin  DVT prophylaxis: Heparin drin Code Status: Full code Family Communication: Wife at bedside Disposition Plan: Patient is sick with acute PE and CTA evidence of right heart strain, requiring inpatient workup and heparin drip, expect more than 2 midnight hospital stay Consults called: PCCM Admission status: Tele admit   Emeline General MD [facility] Pager [phone]  10/14/2022, 1:44 PM

## 2022-10-14 NOTE — ED Provider Notes (Signed)
[place] EMERGENCY DEPARTMENT AT [facility] Provider Note   CSN: [id number] Arrival date & time: 10/14/22  1120     History  Chief Complaint  Patient presents with   Shortness of Breath    Kyle Baldwin is a 77 y.o. male.  Patient here as an outpatient CT scan done for monitoring of his aortic aneurysm and was found to have a large clot.  He came for evaluation.  He has been having some shortness of breath here recently.  Has noticed maybe some left leg pain and swelling.  No history of blood clots.  No history of fairly recent surgery or travel.  History of MI, CAD.  The history is provided by the patient.       Home Medications Prior to Admission medications   Medication Sig Start Date End Date Taking? Authorizing Provider  aspirin EC 81 MG tablet Take 81 mg by mouth daily.    [provider]  cyanocobalamin 1000 MCG tablet Take 1,000 mcg by mouth daily.     [provider]  losartan (COZAAR) 100 MG tablet Take 100 mg by mouth daily.  02/05/12   [provider]  metoprolol tartrate (LOPRESSOR) 50 MG tablet Take 1 tablet (50 mg total) by mouth 2 (two) times daily. 07/15/21 09/01/22  [place], Peter M, MD  nitroGLYCERIN (NITROSTAT) 0.4 MG SL tablet Place 1 tablet (0.4 mg total) under the tongue every 5 (five) minutes as needed for chest pain. 08/30/21   [place], Peter M, MD  omeprazole (PRILOSEC OTC) 20 MG tablet Take one tablet by mouth once daily Oral 01/19/12   [provider]  Polyethyl Glycol-Propyl Glycol (SYSTANE) 0.4-0.3 % SOLN Place 1 drop into both eyes daily as needed (Dry eye).    [provider]  simvastatin (ZOCOR) 40 MG tablet Take 1 tablet (40 mg total) by mouth at bedtime. 07/04/22 06/29/23  [place], Peter M, MD  sodium chloride (OCEAN) 0.65 % SOLN nasal spray Place 1 spray into both nostrils at bedtime.    [provider]  Vitamin D, Ergocalciferol, (DRISDOL) 1.25 MG (50000 UNIT) CAPS capsule Take 50,000 Units  by mouth once a week. 06/22/21   [provider]      Allergies    Codeine, Morphine and codeine, and Lisinopril    Review of Systems   Review of Systems  Physical Exam Updated Vital Signs BP 122/65 (BP Location: Right Arm)   Pulse 71   Temp 98.8 F (37.1 C)   Resp 20   Ht 6' (1.829 m)   Wt 107 kg   SpO2 98%   BMI 31.99 kg/m  Physical Exam Vitals and nursing note reviewed.  Constitutional:      General: He is not in acute distress.    Appearance: He is well-developed. He is not ill-appearing.  HENT:     Head: Normocephalic and atraumatic.  Eyes:     Extraocular Movements: Extraocular movements intact.     Conjunctiva/sclera: Conjunctivae normal.     Pupils: Pupils are equal, round, and reactive to light.  Cardiovascular:     Rate and Rhythm: Normal rate and regular rhythm.     Pulses: Normal pulses.     Heart sounds: Normal heart sounds. No murmur heard. Pulmonary:     Effort: Pulmonary effort is normal. No respiratory distress.     Breath sounds: Normal breath sounds. No decreased breath sounds or wheezing.  Abdominal:     Palpations: Abdomen is soft.  Tenderness: There is no abdominal tenderness.  Musculoskeletal:        General: No swelling. Normal range of motion.     Cervical back: Normal range of motion and neck supple.  Skin:    General: Skin is warm and dry.     Capillary Refill: Capillary refill takes less than 2 seconds.  Neurological:     General: No focal deficit present.     Mental Status: He is alert.  Psychiatric:        Mood and Affect: Mood normal.     ED Results / Procedures / Treatments   Labs (all labs ordered are listed, but only abnormal results are displayed) Labs Reviewed  CBC  BASIC METABOLIC PANEL  BRAIN NATRIURETIC PEPTIDE  HEPARIN LEVEL (UNFRACTIONATED)  LACTIC ACID, PLASMA  TROPONIN I (HIGH SENSITIVITY)  TROPONIN I (HIGH SENSITIVITY)    EKG EKG Interpretation  Date/Time:  Friday Oct 14 2022 11:30:44  EDT Ventricular Rate:  72 PR Interval:  184 QRS Duration: 90 QT Interval:  376 QTC Calculation: 411 R Axis:   58 Text Interpretation: Sinus rhythm with Premature atrial complexes with Abberant conduction Low voltage QRS Borderline ECG When compared with ECG of 12-Aug-2021 09:17, PREVIOUS ECG IS PRESENT Confirmed by Virgina Norfolk ([id number]) on 10/14/2022 11:35:48 AM  Radiology DG Chest 2 View  Result Date: 10/14/2022 CLINICAL DATA:  Shortness of breath. EXAM: CHEST - 2 VIEW COMPARISON:  January 27, 2019. FINDINGS: The heart size and mediastinal contours are within normal limits. Status post coronary artery bypass graft. Both lungs are clear. The visualized skeletal structures are unremarkable. IMPRESSION: No acute radiographic cardiopulmonary disease. Pulmonary embolus is noted on CT scan of same day. Electronically Signed   By: Lupita Raider M.D.   On: 10/14/2022 12:02   CT ANGIO CHEST AORTA W/CM & OR WO/CM  Addendum Date: 10/14/2022   ADDENDUM REPORT: 10/14/2022 10:11 ADDENDUM: Critical Value/emergent results were called by telephone at the time of interpretation on 10/14/2022 at 10:11 am to provider Bryan Lemma, who verbally acknowledged these results. Electronically Signed   By: Lupita Raider M.D.   On: 10/14/2022 10:11   Result Date: 10/14/2022 CLINICAL DATA:  Aortic aneurysm. EXAM: CT ANGIOGRAPHY CHEST WITH CONTRAST TECHNIQUE: Multidetector CT imaging of the chest was performed using the standard protocol during bolus administration of intravenous contrast. Multiplanar CT image reconstructions and MIPs were obtained to evaluate the vascular anatomy. RADIATION DOSE REDUCTION: This exam was performed according to the departmental dose-optimization program which includes automated exposure control, adjustment of the mA and/or kV according to patient size and/or use of iterative reconstruction technique. CONTRAST:  50mL OMNIPAQUE IOHEXOL 350 MG/ML SOLN COMPARISON:  None Available. FINDINGS:  Cardiovascular: Status post coronary artery bypass graft. There is no evidence of thoracic aortic aneurysm or dissection. Normal cardiac size. No pericardial effusion. Filling defect is noted in lower lobe branch of right pulmonary artery consistent with acute pulmonary embolus. RV/LV ratio of 1.5 is noted suggesting right heart strain. Mediastinum/Nodes: No enlarged mediastinal, hilar, or axillary lymph nodes. Thyroid gland, trachea, and esophagus demonstrate no significant findings. Lungs/Pleura: Lungs are clear. No pleural effusion or pneumothorax. Upper Abdomen: No acute abnormality. Musculoskeletal: No chest wall abnormality. No acute or significant osseous findings. Review of the MIP images confirms the above findings. IMPRESSION: Moderate sized filling defect is noted in lower lobe branch of right pulmonary artery consistent with acute pulmonary embolus. Positive for acute PE with CT evidence of right heart strain (RV/LV Ratio =  1.5) consistent with at least submassive (intermediate risk) PE. The presence of right heart strain has been associated with an increased risk of morbidity and mortality. Please refer to the "Code PE Focused" order set in EPIC. No evidence of thoracic aortic aneurysm or dissection. Status post coronary artery bypass graft. Electronically Signed: By: Lupita Raider M.D. On: 10/14/2022 09:59    Procedures .Critical Care  Performed by: Virgina Norfolk, DO Authorized by: Virgina Norfolk, DO   Critical care provider statement:    Critical care time (minutes):  35   Critical care was necessary to treat or prevent imminent or life-threatening deterioration of the following conditions:  Respiratory failure   Critical care was time spent personally by me on the following activities:  Blood draw for specimens, development of treatment plan with patient or surrogate, discussions with primary provider, discussions with consultants, evaluation of patient's response to treatment,  examination of patient, obtaining history from patient or surrogate, ordering and performing treatments and interventions, ordering and review of laboratory studies, ordering and review of radiographic studies, pulse oximetry, re-evaluation of patient's condition and review of old charts   I assumed direction of critical care for this patient from another provider in my specialty: no     Care discussed with: admitting provider       Medications Ordered in ED Medications  heparin ADULT infusion 100 units/mL (25000 units/280mL) (1,600 Units/hr Intravenous New Bag/Given 10/14/22 1229)  heparin bolus via infusion 5,000 Units (5,000 Units Intravenous Bolus from Bag 10/14/22 1229)    ED Course/ Medical Decision Making/ A&P                             Medical Decision Making Amount and/or Complexity of Data Reviewed Labs: ordered. Radiology: ordered.  Risk Prescription drug management. Decision regarding hospitalization.   Kyle Baldwin is here with pulmonary embolism.  Incidentally was found to have a blood clot on CT scan done outpatient today to evaluate for his abdominal aorta.  Patient has a history of CAD.  He has noticed some shortness of breath of the last few days with exertion.  May be some left lower leg pain as well.  No history of blood clots.  He had a CT scan done per radiology report that shows large pulmonary embolism on the right with right heart strain.  Thankfully hemodynamically he is stable.  He is minimally symptomatic currently.  I have talked with pulmonary team who will come evaluate him but they do recommend IV heparin bolus and infusion.  Not a candidate at this time for thrombolytics or any more aggressive therapy.  Will order echocardiogram.  Will get basic labs and admit to medicine for further care.  Per my review and interpretation of labs thus far no significant findings.  This chart was dictated using voice recognition software.  Despite best efforts to  proofread,  errors can occur which can change the documentation meaning.     Final Clinical Impression(s) / ED Diagnoses Final diagnoses:  Acute pulmonary embolism, unspecified pulmonary embolism type, unspecified whether acute cor pulmonale present [facility])    Rx / DC Orders ED Discharge Orders     None         Virgina Norfolk, DO 10/14/22 1323

## 2022-10-14 NOTE — Consult Note (Addendum)
NAME:  Kyle Baldwin, MRN:  [id number], DOB:  March 02, 1946, LOS: 0 ADMISSION DATE:  10/14/2022, CONSULTATION DATE:  10/14/22 REFERRING MD:  Lockie Mola, CHIEF COMPLAINT:  Abnormal CT    History of Present Illness:  77 yo M referred to ED after undergoing outpt CTA chest to eval for aortic aneurysm (no evidence of this on imaging), and instead found to have R sided PE with concern for R heart strain given RV/LV 1.5   HDS, does have some SOB-- chronic to a degree but thinks worse over the last 2d. Chronic LLE swelling, does not feel this is worse. No extremity cramping/pain. No chest pain.  No syncope/presyncope -- Does have intermittent palpitations w some associated lightheadedness chronically, but nothing acute.   No recent travel/sedentary periods, no hx Cancer, no heme dx hx or prior clots, does not smoke.   He was started on heparin gtt  PCCM consulted in this setting   Pertinent  Medical History  CAD s/p CABG x3 (2007)  HTN GERD HLD  Significant Hospital Events: Including procedures, antibiotic start and stop dates in addition to other pertinent events   5/17 to ED after outpt imaging revealed acute R PE with evidence of R heart strain on CT (RV:LV 1.5) started on hep gtt and PCCM consulted    Interim History / Subjective:  Starting on hep gtt   Objective   Blood pressure 122/65, pulse 71, temperature 98.8 F (37.1 C), resp. rate 20, height 6' (1.829 m), weight 107 kg, SpO2 98 %.       No intake or output data in the 24 hours ending 10/14/22 1258 Filed Weights   10/14/22 1131  Weight: 107 kg    Examination: General: WDWN older adult M NAD HENT: NCAT pink mm anicteric sclera  Lungs: CTAb on RA  Cardiovascular: rrr  Abdomen: soft round  Extremities: LLE non-pitting edema without erythema  Neuro: AAO x4 GU: defer  Resolved Hospital Problem list     Assessment & Plan:   Submassive PE -evidence of R heart strain on CT w RV/LV 1.5  -HDS and on RA  -seems this might  be unprovoked  P -echo, trops, bnp, LA  -Ok for [facility] admission  -rec hep gtt then DOAC -BLE [place] -if declines, would reach out to IR re intervention. no contraindications for lytics in event of critical decompensation      Best Practice (right click and "Reselect all SmartList Selections" daily)   Per primary   Labs   CBC: No results for input(s): "WBC", "NEUTROABS", "HGB", "HCT", "MCV", "PLT" in the last 168 hours.  Basic Metabolic Panel: No results for input(s): "NA", "K", "CL", "CO2", "GLUCOSE", "BUN", "CREATININE", "CALCIUM", "MG", "PHOS" in the last 168 hours. GFR: Estimated Creatinine Clearance: 63.1 mL/min (A) (by C-G formula based on SCr of 1.28 mg/dL (H)). No results for input(s): "PROCALCITON", "WBC", "LATICACIDVEN" in the last 168 hours.  Liver Function Tests: No results for input(s): "AST", "ALT", "ALKPHOS", "BILITOT", "PROT", "ALBUMIN" in the last 168 hours. No results for input(s): "LIPASE", "AMYLASE" in the last 168 hours. No results for input(s): "AMMONIA" in the last 168 hours.  ABG No results found for: "PHART", "PCO2ART", "PO2ART", "HCO3", "TCO2", "ACIDBASEDEF", "O2SAT"   Coagulation Profile: No results for input(s): "INR", "PROTIME" in the last 168 hours.  Cardiac Enzymes: No results for input(s): "CKTOTAL", "CKMB", "CKMBINDEX", "TROPONINI" in the last 168 hours.  HbA1C: No results found for: "HGBA1C"  CBG: No results for input(s): "GLUCAP" in the last 168  hours.  Review of Systems:   As per HPI   Past Medical History:  He,  has a past medical history of Bilateral carpal tunnel syndrome, Bladder cancer (HCC) (2005), CAD (coronary artery disease), Diverticulosis, Fatty liver, GERD (gastroesophageal reflux disease), Hyperlipidemia, Hypertension, Kidney stones, Liver cyst (right), Myocardial infarction (HCC) (2007), and Vitamin B 12 deficiency.   Surgical History:   Past Surgical History:  Procedure Laterality Date   BLADDER SURGERY  2005    CORONARY STENT INTERVENTION N/A 08/12/2021   Procedure: CORONARY STENT INTERVENTION;  Surgeon: Swaziland, Peter M, MD;  Location: [facility] INVASIVE CV LAB;  Service: Cardiovascular;  Laterality: N/A;   heart bypass  2007   3 vessels   LEFT HEART CATH AND CORS/GRAFTS ANGIOGRAPHY N/A 08/12/2021   Procedure: LEFT HEART CATH AND CORS/GRAFTS ANGIOGRAPHY;  Surgeon: [place]il[place], Peter M, MD;  Location: [facility] INVASIVE CV LAB;  Service: Cardiovascular;  Laterality: N/A;   LUMBAR LAMINECTOMY       Social History:   reports that he quit smoking about 54 years ago. His smoking use included cigarettes. He has never used smokeless tobacco. He reports that he does not drink alcohol and does not use drugs.   Family History:  His family history includes Colon cancer (age of onset: 68) in his maternal grandfather; Diabetes in his maternal grandmother; Heart disease in his maternal grandmother. There is no history of Stomach cancer.   Allergies Allergies  Allergen Reactions   Codeine Nausea And Vomiting   Morphine And Codeine Nausea And Vomiting   Lisinopril     Other reaction(s): Cough, sore throat     Home Medications  Prior to Admission medications   Medication Sig Start Date End Date Taking? Authorizing Provider  aspirin EC 81 MG tablet Take 81 mg by mouth daily.    [provider]  cyanocobalamin 1000 MCG tablet Take 1,000 mcg by mouth daily.     [provider]  losartan (COZAAR) 100 MG tablet Take 100 mg by mouth daily.  02/05/12   [provider]  metoprolol tartrate (LOPRESSOR) 50 MG tablet Take 1 tablet (50 mg total) by mouth 2 (two) times daily. 07/15/21 09/01/22  [place], Peter M, MD  nitroGLYCERIN (NITROSTAT) 0.4 MG SL tablet Place 1 tablet (0.4 mg total) under the tongue every 5 (five) minutes as needed for chest pain. 08/30/21   [place], Peter M, MD  omeprazole (PRILOSEC OTC) 20 MG tablet Take one tablet by mouth once daily Oral 01/19/12   [provider]  Polyethyl Glycol-Propyl  Glycol (SYSTANE) 0.4-0.3 % SOLN Place 1 drop into both eyes daily as needed (Dry eye).    [provider]  simvastatin (ZOCOR) 40 MG tablet Take 1 tablet (40 mg total) by mouth at bedtime. 07/04/22 06/29/23  [place], Peter M, MD  sodium chloride (OCEAN) 0.65 % SOLN nasal spray Place 1 spray into both nostrils at bedtime.    [provider]  Vitamin D, Ergocalciferol, (DRISDOL) 1.25 MG (50000 UNIT) CAPS capsule Take 50,000 Units by mouth once a week. 06/22/21   [provider]     Critical care time: na     Tessie Fass MSN, AGACNP-BC [facility] Pulmonary/Critical Care Medicine Amion for pager  10/14/2022, 1:42 PM

## 2022-10-14 NOTE — Telephone Encounter (Signed)
Received a telephone call from Dr. Chilton Si from radiology about patient Kyle Baldwin who was sent for a CTA of the chest to evaluate for aortic aneurysm.  Did not show any evidence of aortic aneurysm, but did show evidence of a right-sided PE with right-sided heart strain.  The patient is no longer at the radiology department from [facility].  Will therefore contact him and recommend he go to the ER for evaluation of what appears to be an acute PE on CT scan.  He was actually seen on April 4 by [name], NP for palpitations and intermittent chest discomfort.  Will contact the patient to recommend he go to the [facility] for evaluation.  Will also also contact the inpatient Cardsmaster - [facility].    Bryan Lemma, MD

## 2022-10-14 NOTE — Progress Notes (Signed)
ANTICOAGULATION CONSULT NOTE - Initial Consult  Pharmacy Consult for heparin   Indication: pulmonary embolus, incidental finding on CT   Allergies  Allergen Reactions   Codeine Nausea And Vomiting   Morphine And Codeine Nausea And Vomiting   Lisinopril     Other reaction(s): Cough, sore throat    Patient Measurements: Weight: 107 kg (235 lb 14.3 oz) Heparin Dosing Weight: 100kg   Vital Signs: Temp: 98.8 F (37.1 C) (05/17 1131) BP: 122/65 (05/17 1131) Pulse Rate: 71 (05/17 1131)  Labs: No labs resulted currently. CBC ordered.   Estimated Creatinine Clearance: 63.1 mL/min (A) (by C-G formula based on SCr of 1.28 mg/dL (H)).   Medical History: Past Medical History:  Diagnosis Date   Bilateral carpal tunnel syndrome    Bladder cancer (HCC) 2005   bladder cancer in situ 2005   CAD (coronary artery disease)    Diverticulosis    Fatty liver    GERD (gastroesophageal reflux disease)    Hyperlipidemia    Hypertension    Kidney stones    Liver cyst right   hepatic lobe cyst   Myocardial infarction [facility]) 2007   heart bypass 2007   Vitamin B 12 deficiency     Assessment: Patient went for CT for monitoring of known aortic aneurysm and had an incidental finding of PE. CBC ordered. Currently on room air. No on anticoagulation prior to admission.   Goal of Therapy:  Heparin level 0.3-0.7 units/ml Monitor platelets by anticoagulation protocol: Yes   Plan:  Give 5000 units bolus x 1 Start heparin infusion at 1600 units/hr Check anti-Xa level in 8 hours and daily while on heparin Continue to monitor H&H and platelets  Estill Batten, PharmD, BCCCP  10/14/2022,11:51 AM

## 2022-10-14 NOTE — ED Triage Notes (Signed)
Pt was having routine CTA chest today as a follow up on known aortic aneurysm and had incidental finding of PE. Reports DOE and dry cough x 2 days but in NAD.

## 2022-10-14 NOTE — Telephone Encounter (Signed)
Called  contacted patient . Patient is aware he needs to go to closest ER for evaluation.   Patient states he lives near [facility]. He states he will go there.  RN contacted  in hospital heart care coordinator , notified ER triage

## 2022-10-14 NOTE — ED Notes (Signed)
ED TO INPATIENT HANDOFF REPORT  ED Nurse Name and Phone #: [phone]  S Name/Age/Gender Dia Sitter 77 y.o. male Room/Bed: 029C/029C  Code Status   Code Status: Full Code  Home/SNF/Other Home Patient oriented to: self, place Is this baseline? Yes   Triage Complete: Triage complete  Chief Complaint Pulmonary embolism [facility]) [I26.99]  Triage Note Pt was having routine CTA chest today as a follow up on known aortic aneurysm and had incidental finding of PE. Reports DOE and dry cough x 2 days but in NAD.    Allergies Allergies  Allergen Reactions   Codeine Nausea And Vomiting   Morphine And Codeine Nausea And Vomiting   Lisinopril     Other reaction(s): Cough, sore throat    Level of Care/Admitting Diagnosis ED Disposition     ED Disposition  Admit   Condition  --   Comment  Hospital Area: [facility] [[id number]]  Level of Care: Telemetry Medical [104]  May admit patient to [facility] or [facility] if equivalent level of care is available:: No  Covid Evaluation: Asymptomatic - no recent exposure (last 10 days) testing not required  Diagnosis: Pulmonary embolism [facility]) [[id number]]  Admitting Physician: Emeline General [[id number]]  Attending Physician: Emeline General [[id number]]  Certification:: I certify this patient will need inpatient services for at least 2 midnights  Estimated Length of Stay: 2          B Medical/Surgery History Past Medical History:  Diagnosis Date   Bilateral carpal tunnel syndrome    Bladder cancer (HCC) 2005   bladder cancer in situ 2005   CAD (coronary artery disease)    Diverticulosis    Fatty liver    GERD (gastroesophageal reflux disease)    Hyperlipidemia    Hypertension    Kidney stones    Liver cyst right   hepatic lobe cyst   Myocardial infarction (HCC) 2007   heart bypass 2007   Vitamin B 12 deficiency    Past Surgical History:  Procedure Laterality Date   BLADDER SURGERY  2005   CORONARY STENT  INTERVENTION N/A 08/12/2021   Procedure: CORONARY STENT INTERVENTION;  Surgeon: Swaziland, Peter M, MD;  Location: [facility] INVASIVE CV LAB;  Service: Cardiovascular;  Laterality: N/A;   heart bypass  2007   3 vessels   LEFT HEART CATH AND CORS/GRAFTS ANGIOGRAPHY N/A 08/12/2021   Procedure: LEFT HEART CATH AND CORS/GRAFTS ANGIOGRAPHY;  Surgeon: Swaziland, Peter M, MD;  Location: [facility] INVASIVE CV LAB;  Service: Cardiovascular;  Laterality: N/A;   LUMBAR LAMINECTOMY       A IV Location/Drains/Wounds Patient Lines/Drains/Airways Status     Active Line/Drains/Airways     Name Placement date Placement time Site Days   Peripheral IV 10/14/22 22 G Anterior;Left Forearm 10/14/22  1219  Forearm  less than 1   Peripheral IV 10/14/22 20 G Anterior;Distal;Right;Upper Arm 10/14/22  1444  Arm  less than 1            Intake/Output Last 24 hours No intake or output data in the 24 hours ending 10/14/22 1652  Labs/Imaging Results for orders placed or performed during the hospital encounter of 10/14/22 (from the past 48 hour(s))  Basic metabolic panel     Status: Abnormal   Collection Time: 10/14/22 12:18 PM  Result Value Ref Range   Sodium 140 135 - 145 mmol/L   Potassium 3.8 3.5 - 5.1 mmol/L   Chloride 110 98 - 111 mmol/L   CO2  23 22 - 32 mmol/L   Glucose, Bld 154 (H) 70 - 99 mg/dL    Comment: Glucose reference range applies only to samples taken after fasting for at least 8 hours.   BUN 21 8 - 23 mg/dL   Creatinine, Ser 9.81 (H) 0.61 - 1.24 mg/dL   Calcium 8.8 (L) 8.9 - 10.3 mg/dL   GFR, Estimated 59 (L) >60 mL/min    Comment: (NOTE) Calculated using the CKD-EPI Creatinine Equation (2021)    Anion gap 7 5 - 15    Comment: Performed at [facility], [address], [place], [place] [address]  CBC     Status: None   Collection Time: 10/14/22 12:18 PM  Result Value Ref Range   WBC 6.9 4.0 - 10.5 K/uL   RBC 4.76 4.22 - 5.81 MIL/uL   Hemoglobin 14.5 13.0 - 17.0 g/dL   HCT 82.9 56.2 - 13.0 %    MCV 90.3 80.0 - 100.0 fL   MCH 30.5 26.0 - 34.0 pg   MCHC 33.7 30.0 - 36.0 g/dL   RDW 86.5 78.4 - 69.6 %   Platelets 179 150 - 400 K/uL   nRBC 0.0 0.0 - 0.2 %    Comment: Performed at [facility], [address], [place], [place] [address]  Troponin I (High Sensitivity)     Status: Abnormal   Collection Time: 10/14/22 12:18 PM  Result Value Ref Range   Troponin I (High Sensitivity) 79 (H) <18 ng/L    Comment: (NOTE) Elevated high sensitivity troponin I (hsTnI) values and significant  changes across serial measurements may suggest ACS but many other  chronic and acute conditions are known to elevate hsTnI results.  Refer to the "Links" section for chest pain algorithms and additional  guidance. Performed at [facility], [address], [place], [place] [address]   Brain natriuretic peptide     Status: Abnormal   Collection Time: 10/14/22 12:18 PM  Result Value Ref Range   B Natriuretic Peptide 142.2 (H) 0.0 - 100.0 pg/mL    Comment: Performed at [facility], [address] [place], [place] [address]  Lactic acid, plasma     Status: None   Collection Time: 10/14/22 12:58 PM  Result Value Ref Range   Lactic Acid, Venous 1.0 0.5 - 1.9 mmol/L    Comment: Performed at [facility], [address] [place], [place] [address]  Troponin I (High Sensitivity)     Status: Abnormal   Collection Time: 10/14/22  1:28 PM  Result Value Ref Range   Troponin I (High Sensitivity) 77 (H) <18 ng/L    Comment: (NOTE) Elevated high sensitivity troponin I (hsTnI) values and significant  changes across serial measurements may suggest ACS but many other  chronic and acute conditions are known to elevate hsTnI results.  Refer to the "Links" section for chest pain algorithms and additional  guidance. Performed at [facility], [address], [place], [place] [address]    ECHOCARDIOGRAM COMPLETE  Result Date: 10/14/2022    ECHOCARDIOGRAM REPORT   Patient Name:    MERT SUGIMOTO Date of Exam: 10/14/2022 Medical Rec #:  [id number]       Height:       72.0 in Accession #:    [id number]      Weight:       235.9 lb Date of Birth:  21-Jun-1945        BSA:  2.285 m Patient Age:    77 years        BP:           122/65 mmHg Patient Gender: M               HR:           64 bpm. Exam Location:  Inpatient Procedure: 2D Echo, Cardiac Doppler, Color Doppler and Intracardiac            Opacification Agent Indications:    Pulmonary Embolism  History:        Patient has prior history of Echocardiogram examinations, most                 recent 07/27/2021. CAD, Prior CABG, Signs/Symptoms:Edema and                 Shortness of Breath; Risk Factors:Hypertension and Former                 Smoker.  Sonographer:    Darlys Gales Referring Phys: [id number] ADAM CURATOLO IMPRESSIONS  1. Left ventricular ejection fraction, by estimation, is 60 to 65%. The left ventricle has normal function. The left ventricle has no regional wall motion abnormalities. Left ventricular diastolic parameters are consistent with Grade I diastolic dysfunction (impaired relaxation).  2. The RV is not well visualized but in limited images it does appear dilated and at least mildly hypokinetic indicative of RV strain. Right ventricular systolic function is mildly reduced. The right ventricular size is moderately enlarged. Tricuspid regurgitation signal is inadequate for assessing PA pressure.  3. Right atrial size was mildly dilated.  4. The mitral valve is normal in structure. No evidence of mitral valve regurgitation. No evidence of mitral stenosis.  5. The aortic valve is tricuspid. There is mild calcification of the aortic valve. Aortic valve regurgitation is not visualized. Aortic valve sclerosis/calcification is present, without any evidence of aortic stenosis. Aortic valve area, by VTI measures  3.13 cm. Aortic valve mean gradient measures 4.0 mmHg. Aortic valve Vmax measures 1.36 m/s. FINDINGS  Left Ventricle: Left  ventricular ejection fraction, by estimation, is 60 to 65%. The left ventricle has normal function. The left ventricle has no regional wall motion abnormalities. Definity contrast agent was given IV to delineate the left ventricular  endocardial borders. The left ventricular internal cavity size was normal in size. There is no left ventricular hypertrophy. Left ventricular diastolic parameters are consistent with Grade I diastolic dysfunction (impaired relaxation). Right Ventricle: The RV is not well visualized but in limited images it does appear dilated and at least mildly hypokinetic indicative of RV strain. The right ventricular size is moderately enlarged. No increase in right ventricular wall thickness. Right  ventricular systolic function is mildly reduced. Tricuspid regurgitation signal is inadequate for assessing PA pressure. Left Atrium: Left atrial size was normal in size. Right Atrium: Right atrial size was mildly dilated. Pericardium: There is no evidence of pericardial effusion. Mitral Valve: The mitral valve is normal in structure. No evidence of mitral valve regurgitation. No evidence of mitral valve stenosis. Tricuspid Valve: The tricuspid valve is normal in structure. Tricuspid valve regurgitation is trivial. No evidence of tricuspid stenosis. Aortic Valve: The aortic valve is tricuspid. There is mild calcification of the aortic valve. Aortic valve regurgitation is not visualized. Aortic valve sclerosis/calcification is present, without any evidence of aortic stenosis. Aortic valve mean gradient measures 4.0 mmHg. Aortic valve peak gradient measures 7.4 mmHg. Aortic valve area, by VTI measures  3.13 cm. Pulmonic Valve: The pulmonic valve was normal in structure. Pulmonic valve regurgitation is trivial. No evidence of pulmonic stenosis. Aorta: The aortic root is normal in size and structure. Venous: The inferior vena cava was not well visualized. IAS/Shunts: No atrial level shunt detected by color  flow Doppler.  LEFT VENTRICLE PLAX 2D LVIDd:         4.90 cm   Diastology LVIDs:         2.80 cm   LV e' medial:    6.42 cm/s LV PW:         1.20 cm   LV E/e' medial:  9.0 LV IVS:        0.90 cm   LV e' lateral:   11.10 cm/s LVOT diam:     2.10 cm   LV E/e' lateral: 5.2 LV SV:         84 LV SV Index:   37 LVOT Area:     3.46 cm  RIGHT VENTRICLE RV S prime:     12.40 cm/s TAPSE (M-mode): 2.2 cm LEFT ATRIUM             Index        RIGHT ATRIUM           Index LA Vol (A2C):   84.4 ml 36.93 ml/m  RA Area:     26.20 cm LA Vol (A4C):   66.1 ml 28.92 ml/m  RA Volume:   94.50 ml  41.35 ml/m LA Biplane Vol: 75.8 ml 33.17 ml/m  AORTIC VALVE AV Area (Vmax):    2.65 cm AV Area (Vmean):   3.06 cm AV Area (VTI):     3.13 cm AV Vmax:           136.00 cm/s AV Vmean:          91.000 cm/s AV VTI:            0.269 m AV Peak Grad:      7.4 mmHg AV Mean Grad:      4.0 mmHg LVOT Vmax:         104.00 cm/s LVOT Vmean:        80.300 cm/s LVOT VTI:          0.243 m LVOT/AV VTI ratio: 0.90 MITRAL VALVE                TRICUSPID VALVE MV Area (PHT): 1.83 cm     TR Peak grad:   15.5 mmHg MV Decel Time: 414 msec     TR Vmax:        197.00 cm/s MV E velocity: 57.90 cm/s MV A velocity: 112.00 cm/s  SHUNTS MV E/A ratio:  0.52         Systemic VTI:  0.24 m                             Systemic Diam: 2.10 cm Arvilla Meres MD Electronically signed by Arvilla Meres MD Signature Date/Time: 10/14/2022/2:44:23 PM    Final    DG Chest 2 View  Result Date: 10/14/2022 CLINICAL DATA:  Shortness of breath. EXAM: CHEST - 2 VIEW COMPARISON:  January 27, 2019. FINDINGS: The heart size and mediastinal contours are within normal limits. Status post coronary artery bypass graft. Both lungs are clear. The visualized skeletal structures are unremarkable. IMPRESSION: No acute radiographic cardiopulmonary disease. Pulmonary embolus is noted on CT scan of same day. Electronically Signed  By: Lupita Raider M.D.   On: 10/14/2022 12:02   CT ANGIO CHEST  AORTA W/CM & OR WO/CM  Addendum Date: 10/14/2022   ADDENDUM REPORT: 10/14/2022 10:11 ADDENDUM: Critical Value/emergent results were called by telephone at the time of interpretation on 10/14/2022 at 10:11 am to provider Bryan Lemma, who verbally acknowledged these results. Electronically Signed   By: Lupita Raider M.D.   On: 10/14/2022 10:11   Result Date: 10/14/2022 CLINICAL DATA:  Aortic aneurysm. EXAM: CT ANGIOGRAPHY CHEST WITH CONTRAST TECHNIQUE: Multidetector CT imaging of the chest was performed using the standard protocol during bolus administration of intravenous contrast. Multiplanar CT image reconstructions and MIPs were obtained to evaluate the vascular anatomy. RADIATION DOSE REDUCTION: This exam was performed according to the departmental dose-optimization program which includes automated exposure control, adjustment of the mA and/or kV according to patient size and/or use of iterative reconstruction technique. CONTRAST:  50mL OMNIPAQUE IOHEXOL 350 MG/ML SOLN COMPARISON:  None Available. FINDINGS: Cardiovascular: Status post coronary artery bypass graft. There is no evidence of thoracic aortic aneurysm or dissection. Normal cardiac size. No pericardial effusion. Filling defect is noted in lower lobe branch of right pulmonary artery consistent with acute pulmonary embolus. RV/LV ratio of 1.5 is noted suggesting right heart strain. Mediastinum/Nodes: No enlarged mediastinal, hilar, or axillary lymph nodes. Thyroid gland, trachea, and esophagus demonstrate no significant findings. Lungs/Pleura: Lungs are clear. No pleural effusion or pneumothorax. Upper Abdomen: No acute abnormality. Musculoskeletal: No chest wall abnormality. No acute or significant osseous findings. Review of the MIP images confirms the above findings. IMPRESSION: Moderate sized filling defect is noted in lower lobe branch of right pulmonary artery consistent with acute pulmonary embolus. Positive for acute PE with CT evidence of  right heart strain (RV/LV Ratio = 1.5) consistent with at least submassive (intermediate risk) PE. The presence of right heart strain has been associated with an increased risk of morbidity and mortality. Please refer to the "Code PE Focused" order set in EPIC. No evidence of thoracic aortic aneurysm or dissection. Status post coronary artery bypass graft. Electronically Signed: By: Lupita Raider M.D. On: 10/14/2022 09:59    Pending Labs Unresulted Labs (From admission, onward)     Start     Ordered   10/15/22 0500  Heparin level (unfractionated)  Daily,   R     See Hyperspace for full Linked Orders Report.   10/14/22 1157   10/15/22 0500  CBC  Daily,   R     See Hyperspace for full Linked Orders Report.   10/14/22 1157   10/14/22 2000  Heparin level (unfractionated)  Once-Timed,   URGENT        10/14/22 1157            Vitals/Pain Today's Vitals   10/14/22 1127 10/14/22 1131 10/14/22 1515 10/14/22 1645  BP:  122/65 109/67 116/68  Pulse:  71 64 62  Resp:  20 (!) 23 18  Temp:  98.8 F (37.1 C)    SpO2:  98% 96% 97%  Weight:  107 kg    Height:  6' (1.829 m)    PainSc: 0-No pain       Isolation Precautions No active isolations  Medications Medications  heparin ADULT infusion 100 units/mL (25000 units/258mL) (1,600 Units/hr Intravenous New Bag/Given 10/14/22 1229)  aspirin EC tablet 81 mg (has no administration in time range)  losartan (COZAAR) tablet 100 mg (has no administration in time range)  metoprolol tartrate (LOPRESSOR) tablet 50  mg (has no administration in time range)  simvastatin (ZOCOR) tablet 40 mg (has no administration in time range)  perflutren lipid microspheres (DEFINITY) IV suspension (2 mLs Intravenous Given 10/14/22 1359)  pantoprazole (PROTONIX) EC tablet 40 mg (has no administration in time range)  heparin bolus via infusion 5,000 Units (5,000 Units Intravenous Bolus from Bag 10/14/22 1229)    Mobility walks with device     Focused  Assessments Neuro Assessment Handoff:  Swallow screen pass? Yes  Cardiac Rhythm: Normal sinus rhythm       Neuro Assessment: Within Defined Limits Neuro Checks:      Has TPA been given? No If patient is a Neuro Trauma and patient is going to OR before floor call report to 4N Charge nurse: [phone] or [phone]   R Recommendations: See Admitting Provider Note  Report given to:   Additional Notes:

## 2022-10-14 NOTE — Plan of Care (Signed)
  Problem: Education: Goal: Knowledge of General Education information will improve Description: Including pain rating scale, medication(s)/side effects and non-pharmacologic comfort measures 10/14/2022 1901 by Horris Latino, RN Outcome: Progressing 10/14/2022 1900 by Horris Latino, RN Outcome: Progressing

## 2022-10-14 NOTE — Telephone Encounter (Signed)
Calling with Critical results for pt. Dr. Roque Lias needing to speak with DOD. Call transferred

## 2022-10-14 NOTE — Telephone Encounter (Signed)
Called no answer left message  

## 2022-10-14 NOTE — Progress Notes (Signed)
ANTICOAGULATION CONSULT NOTE - Initial Consult  Pharmacy Consult for heparin   Indication: pulmonary embolus, incidental finding on CT   Allergies  Allergen Reactions   Codeine Nausea And Vomiting   Morphine And Codeine Nausea And Vomiting   Lisinopril     Other reaction(s): Cough, sore throat    Patient Measurements: Height: 6' (182.9 cm) Weight: 107 kg (235 lb 14.3 oz) IBW/kg (Calculated) : 77.6 Heparin Dosing Weight: 100kg   Vital Signs: Temp: 98.4 F (36.9 C) (05/17 2010) Temp Source: Oral (05/17 1736) BP: 106/75 (05/17 2010) Pulse Rate: 72 (05/17 2010)  Labs: No labs resulted currently. CBC ordered.   Estimated Creatinine Clearance: 64.1 mL/min (A) (by C-G formula based on SCr of 1.26 mg/dL (H)).   Medical History: Past Medical History:  Diagnosis Date   Bilateral carpal tunnel syndrome    Bladder cancer (HCC) 2005   bladder cancer in situ 2005   CAD (coronary artery disease)    Diverticulosis    Fatty liver    GERD (gastroesophageal reflux disease)    Hyperlipidemia    Hypertension    Kidney stones    Liver cyst right   hepatic lobe cyst   Myocardial infarction [facility]) 2007   heart bypass 2007   Vitamin B 12 deficiency     Assessment: Patient went for CT for monitoring of known aortic aneurysm and had an incidental finding of PE. CBC ordered. Currently on room air. Not on anticoagulation prior to admission.   Last CBC WNL  HL 0.65 - therapeutic  Goal of Therapy:  Heparin level 0.3-0.7 units/ml Monitor platelets by anticoagulation protocol: Yes   Plan:  Continue heparin infusion at 1600 units/hr Check anti-Xa level  daily while on heparin Continue to monitor H&H and platelets F/u transition to enteral therapies as able  Calton Dach, PharmD Clinical Pharmacist 10/14/2022 8:49 PM

## 2022-10-14 NOTE — Progress Notes (Signed)
  Echocardiogram 2D Echocardiogram has been performed.  Kyle Baldwin 10/14/2022, 2:41 PM

## 2022-10-15 ENCOUNTER — Inpatient Hospital Stay (HOSPITAL_COMMUNITY): Payer: Medicare Other

## 2022-10-15 DIAGNOSIS — I2699 Other pulmonary embolism without acute cor pulmonale: Secondary | ICD-10-CM | POA: Diagnosis not present

## 2022-10-15 DIAGNOSIS — Z86711 Personal history of pulmonary embolism: Secondary | ICD-10-CM | POA: Diagnosis not present

## 2022-10-15 LAB — CBC
HCT: 37 % — ABNORMAL LOW (ref 39.0–52.0)
Hemoglobin: 12.8 g/dL — ABNORMAL LOW (ref 13.0–17.0)
MCH: 30.5 pg (ref 26.0–34.0)
MCHC: 34.6 g/dL (ref 30.0–36.0)
MCV: 88.1 fL (ref 80.0–100.0)
Platelets: 147 10*3/uL — ABNORMAL LOW (ref 150–400)
RBC: 4.2 MIL/uL — ABNORMAL LOW (ref 4.22–5.81)
RDW: 13.9 % (ref 11.5–15.5)
WBC: 7.2 10*3/uL (ref 4.0–10.5)
nRBC: 0 % (ref 0.0–0.2)

## 2022-10-15 LAB — HEPARIN LEVEL (UNFRACTIONATED): Heparin Unfractionated: 0.72 IU/mL — ABNORMAL HIGH (ref 0.30–0.70)

## 2022-10-15 MED ORDER — APIXABAN 5 MG PO TABS
10.0000 mg | ORAL_TABLET | Freq: Two times a day (BID) | ORAL | Status: DC
  Administered 2022-10-15: 10 mg via ORAL
  Filled 2022-10-15: qty 2

## 2022-10-15 MED ORDER — ACETAMINOPHEN 325 MG PO TABS
650.0000 mg | ORAL_TABLET | Freq: Four times a day (QID) | ORAL | Status: DC | PRN
  Administered 2022-10-15 (×2): 650 mg via ORAL
  Filled 2022-10-15 (×2): qty 2

## 2022-10-15 MED ORDER — APIXABAN 5 MG PO TABS: 5.0000 mg | ORAL_TABLET | Freq: Two times a day (BID) | ORAL | Status: DC

## 2022-10-15 NOTE — Progress Notes (Signed)
PROGRESS NOTE                                                                                                                                                                                                             Patient Demographics:    Kyle Baldwin, is a 77 y.o. male, DOB - [birth year], YQM:[id number]  Outpatient Primary MD for the patient is Garlan Fillers, MD    LOS - 1  Admit date - 10/14/2022    Chief Complaint  Patient presents with   Shortness of Breath       Brief Narrative (HPI from H&P)    77 y.o. male with medical history significant of CAD status post CABG, HTN, HLD, with history of aortic artery aneurysm who went for routine screening CTA test for his aneurysm monitoring where the aneurysm was noted to be improved but he was found to have moderate size right lower lobe PE.  He was subsequently sent here for admission.  Apparently he had noticed some exertional shortness of breath walking in his yard for the last few days.   Subjective:    Larna Daughters today has, No headache, No chest pain, No abdominal pain - No Nausea, No new weakness tingling or numbness, no SOB   Assessment  & Plan :    Acute right sided subsegmental PE with possible mild strain on echocardiogram. 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  He is currently on heparin drip, hemodynamically stable and symptom-free at rest.  Continue heparin drip for another 12 hours then transition him to Eliquis.  Advance activity and monitor.  Post discharge will recommend outpatient follow-up with hematology/oncology to be arranged by PCP.   CAD.  Chest pain-free.  On beta-blocker, aspirin and statin for secondary prevention.  Stable echocardiogram with EF  60%.  Dyslipidemia.  On statin.  Hypertension.  Continue beta-blocker, ARB.      Condition - Fair  Family Communication  :  None  Code Status :  Full  Consults  :  None  PUD Prophylaxis : PPI   Procedures  :     CTA -  Moderate sized filling defect is noted in lower lobe branch of right pulmonary artery consistent with acute pulmonary embolus. Positive for acute PE with CT evidence of right heart strain (RV/LV Ratio = 1.5) consistent with at least submassive (intermediate risk) PE. The presence of right heart strain has been associated with an increased risk of morbidity and mortality. Please refer to the "Code PE Focused" order set in EPIC. No evidence of thoracic aortic aneurysm or dissection. Status post coronary artery bypass graft.  TTE -  1. Left ventricular ejection fraction, by estimation, is 60 to 65%. The left ventricle has normal function. The left ventricle has no regional wall motion abnormalities. Left ventricular diastolic parameters are consistent with Grade I diastolic dysfunction (impaired relaxation).  2. The RV is not well visualized but in limited images it does appear dilated and at least mildly hypokinetic indicative of RV strain. Right ventricular systolic function is mildly reduced. The right ventricular size is moderately enlarged. Tricuspid regurgitation signal is inadequate for assessing PA pressure.  3. Right atrial size was mildly dilated.  4. The mitral valve is normal in structure. No evidence of mitral valve regurgitation. No evidence of mitral stenosis.  5. The aortic valve is tricuspid. There is mild calcification of the aortic valve. Aortic valve regurgitation is not visualized. Aortic valve sclerosis/calcification is present, without any evidence of aortic stenosis. Aortic valve area, by VTI measures  3.13 cm. Aortic valve mean gradient measures 4.0 mmHg. Aortic valve Vmax measures 1.36 m/s.  Leg [place] -       Disposition Plan  :    Status is:  Inpatient   DVT Prophylaxis  :     apixaban (ELIQUIS) tablet 10 mg  apixaban (ELIQUIS) tablet 5 mg   Hep gtt  Lab Results  Component Value Date   PLT 147 (L) 10/15/2022    Diet :  Diet Order             Diet Heart Room service appropriate? Yes; Fluid consistency: Thin  Diet effective now                    Inpatient Medications  Scheduled Meds:  apixaban  10 mg Oral BID   Followed by   Melene Muller ON 10/22/2022] apixaban  5 mg Oral BID   aspirin EC  81 mg Oral Daily   losartan  100 mg Oral Daily   metoprolol tartrate  50 mg Oral BID   pantoprazole  40 mg Oral BID   simvastatin  40 mg Oral QHS   Continuous Infusions:  heparin 1,500 Units/hr (10/15/22 0456)   PRN Meds:.acetaminophen  Antibiotics  :    Anti-infectives (From admission, onward)    None         Objective:   Vitals:   10/14/22 1736 10/14/22 2010 10/15/22 0337 10/15/22 0800  BP: 125/75 106/75 115/61 120/77  Pulse: 63 72 71 61  Resp:  17 20 (!) 22  Temp: 98.3 F (36.8 C) 98.4 F (36.9 C)  97.7 F (36.5 C)  TempSrc: Oral   Oral  SpO2: 94% 95% 94% 93%  Weight:      Height:        Wt Readings from Last 3 Encounters:  10/14/22 107 kg  09/01/22 107.6 kg  07/04/22 106.4 kg     Intake/Output Summary (Last 24 hours) at 10/15/2022 0848 Last data filed at 10/15/2022 0443 Gross per 24 hour  Intake 307.82 ml  Output --  Net 307.82 ml  Physical Exam  Awake Alert, No new F.N deficits, Normal affect [place].AT,PERRAL Supple Neck, No JVD,   Symmetrical Chest wall movement, Good air movement bilaterally, CTAB RRR,No Gallops,Rubs or new Murmurs,  +ve B.Sounds, Abd Soft, No tenderness,   No Cyanosis, Clubbing or edema       Data Review:    Recent Labs  Lab 10/14/22 1218 10/15/22 0320  WBC 6.9 7.2  HGB 14.5 12.8*  HCT 43.0 37.0*  PLT 179 147*  MCV 90.3 88.1  MCH 30.5 30.5  MCHC 33.7 34.6  RDW 13.7 13.9    Recent Labs  Lab 10/14/22 1218 10/14/22 1258  NA 140  --   K  3.8  --   CL 110  --   CO2 23  --   ANIONGAP 7  --   GLUCOSE 154*  --   BUN 21  --   CREATININE 1.26*  --   LATICACIDVEN  --  1.0  BNP 142.2*  --   CALCIUM 8.8*  --       Recent Labs  Lab 10/14/22 1218 10/14/22 1258  LATICACIDVEN  --  1.0  BNP 142.2*  --   CALCIUM 8.8*  --     Radiology Reports ECHOCARDIOGRAM COMPLETE  Result Date: 10/14/2022    ECHOCARDIOGRAM REPORT   Patient Name:   MATTOX BUSCAGLIA Date of Exam: 10/14/2022 Medical Rec #:  [id number]       Height:       72.0 in Accession #:    [id number]      Weight:       235.9 lb Date of Birth:  March 26, 1946        BSA:          2.285 m Patient Age:    77 years        BP:           122/65 mmHg Patient Gender: M               HR:           64 bpm. Exam Location:  Inpatient Procedure: 2D Echo, Cardiac Doppler, Color Doppler and Intracardiac            Opacification Agent Indications:    Pulmonary Embolism  History:        Patient has prior history of Echocardiogram examinations, most                 recent 07/27/2021. CAD, Prior CABG, Signs/Symptoms:Edema and                 Shortness of Breath; Risk Factors:Hypertension and Former                 Smoker.  Sonographer:    Darlys Gales Referring Phys: [id number] ADAM CURATOLO IMPRESSIONS  1. Left ventricular ejection fraction, by estimation, is 60 to 65%. The left ventricle has normal function. The left ventricle has no regional wall motion abnormalities. Left ventricular diastolic parameters are consistent with Grade I diastolic dysfunction (impaired relaxation).  2. The RV is not well visualized but in limited images it does appear dilated and at least mildly hypokinetic indicative of RV strain. Right ventricular systolic function is mildly reduced. The right ventricular size is moderately enlarged. Tricuspid regurgitation signal is inadequate for assessing PA pressure.  3. Right atrial size was mildly dilated.  4. The mitral valve is normal in structure. No evidence of mitral valve regurgitation.  No evidence of mitral stenosis.  5.  The aortic valve is tricuspid. There is mild calcification of the aortic valve. Aortic valve regurgitation is not visualized. Aortic valve sclerosis/calcification is present, without any evidence of aortic stenosis. Aortic valve area, by VTI measures  3.13 cm. Aortic valve mean gradient measures 4.0 mmHg. Aortic valve Vmax measures 1.36 m/s. FINDINGS  Left Ventricle: Left ventricular ejection fraction, by estimation, is 60 to 65%. The left ventricle has normal function. The left ventricle has no regional wall motion abnormalities. Definity contrast agent was given IV to delineate the left ventricular  endocardial borders. The left ventricular internal cavity size was normal in size. There is no left ventricular hypertrophy. Left ventricular diastolic parameters are consistent with Grade I diastolic dysfunction (impaired relaxation). Right Ventricle: The RV is not well visualized but in limited images it does appear dilated and at least mildly hypokinetic indicative of RV strain. The right ventricular size is moderately enlarged. No increase in right ventricular wall thickness. Right  ventricular systolic function is mildly reduced. Tricuspid regurgitation signal is inadequate for assessing PA pressure. Left Atrium: Left atrial size was normal in size. Right Atrium: Right atrial size was mildly dilated. Pericardium: There is no evidence of pericardial effusion. Mitral Valve: The mitral valve is normal in structure. No evidence of mitral valve regurgitation. No evidence of mitral valve stenosis. Tricuspid Valve: The tricuspid valve is normal in structure. Tricuspid valve regurgitation is trivial. No evidence of tricuspid stenosis. Aortic Valve: The aortic valve is tricuspid. There is mild calcification of the aortic valve. Aortic valve regurgitation is not visualized. Aortic valve sclerosis/calcification is present, without any evidence of aortic stenosis. Aortic valve mean  gradient measures 4.0 mmHg. Aortic valve peak gradient measures 7.4 mmHg. Aortic valve area, by VTI measures 3.13 cm. Pulmonic Valve: The pulmonic valve was normal in structure. Pulmonic valve regurgitation is trivial. No evidence of pulmonic stenosis. Aorta: The aortic root is normal in size and structure. Venous: The inferior vena cava was not well visualized. IAS/Shunts: No atrial level shunt detected by color flow Doppler.  LEFT VENTRICLE PLAX 2D LVIDd:         4.90 cm   Diastology LVIDs:         2.80 cm   LV e' medial:    6.42 cm/s LV PW:         1.20 cm   LV E/e' medial:  9.0 LV IVS:        0.90 cm   LV e' lateral:   11.10 cm/s LVOT diam:     2.10 cm   LV E/e' lateral: 5.2 LV SV:         84 LV SV Index:   37 LVOT Area:     3.46 cm  RIGHT VENTRICLE RV S prime:     12.40 cm/s TAPSE (M-mode): 2.2 cm LEFT ATRIUM             Index        RIGHT ATRIUM           Index LA Vol (A2C):   84.4 ml 36.93 ml/m  RA Area:     26.20 cm LA Vol (A4C):   66.1 ml 28.92 ml/m  RA Volume:   94.50 ml  41.35 ml/m LA Biplane Vol: 75.8 ml 33.17 ml/m  AORTIC VALVE AV Area (Vmax):    2.65 cm AV Area (Vmean):   3.06 cm AV Area (VTI):     3.13 cm AV Vmax:  136.00 cm/s AV Vmean:          91.000 cm/s AV VTI:            0.269 m AV Peak Grad:      7.4 mmHg AV Mean Grad:      4.0 mmHg LVOT Vmax:         104.00 cm/s LVOT Vmean:        80.300 cm/s LVOT VTI:          0.243 m LVOT/AV VTI ratio: 0.90 MITRAL VALVE                TRICUSPID VALVE MV Area (PHT): 1.83 cm     TR Peak grad:   15.5 mmHg MV Decel Time: 414 msec     TR Vmax:        197.00 cm/s MV E velocity: 57.90 cm/s MV A velocity: 112.00 cm/s  SHUNTS MV E/A ratio:  0.52         Systemic VTI:  0.24 m                             Systemic Diam: 2.10 cm Arvilla Meres MD Electronically signed by Arvilla Meres MD Signature Date/Time: 10/14/2022/2:44:23 PM    Final    DG Chest 2 View  Result Date: 10/14/2022 CLINICAL DATA:  Shortness of breath. EXAM: CHEST - 2 VIEW  COMPARISON:  January 27, 2019. FINDINGS: The heart size and mediastinal contours are within normal limits. Status post coronary artery bypass graft. Both lungs are clear. The visualized skeletal structures are unremarkable. IMPRESSION: No acute radiographic cardiopulmonary disease. Pulmonary embolus is noted on CT scan of same day. Electronically Signed   By: Lupita Raider M.D.   On: 10/14/2022 12:02   CT ANGIO CHEST AORTA W/CM & OR WO/CM  Addendum Date: 10/14/2022   ADDENDUM REPORT: 10/14/2022 10:11 ADDENDUM: Critical Value/emergent results were called by telephone at the time of interpretation on 10/14/2022 at 10:11 am to provider Bryan Lemma, who verbally acknowledged these results. Electronically Signed   By: Lupita Raider M.D.   On: 10/14/2022 10:11   Result Date: 10/14/2022 CLINICAL DATA:  Aortic aneurysm. EXAM: CT ANGIOGRAPHY CHEST WITH CONTRAST TECHNIQUE: Multidetector CT imaging of the chest was performed using the standard protocol during bolus administration of intravenous contrast. Multiplanar CT image reconstructions and MIPs were obtained to evaluate the vascular anatomy. RADIATION DOSE REDUCTION: This exam was performed according to the departmental dose-optimization program which includes automated exposure control, adjustment of the mA and/or kV according to patient size and/or use of iterative reconstruction technique. CONTRAST:  50mL OMNIPAQUE IOHEXOL 350 MG/ML SOLN COMPARISON:  None Available. FINDINGS: Cardiovascular: Status post coronary artery bypass graft. There is no evidence of thoracic aortic aneurysm or dissection. Normal cardiac size. No pericardial effusion. Filling defect is noted in lower lobe branch of right pulmonary artery consistent with acute pulmonary embolus. RV/LV ratio of 1.5 is noted suggesting right heart strain. Mediastinum/Nodes: No enlarged mediastinal, hilar, or axillary lymph nodes. Thyroid gland, trachea, and esophagus demonstrate no significant findings.  Lungs/Pleura: Lungs are clear. No pleural effusion or pneumothorax. Upper Abdomen: No acute abnormality. Musculoskeletal: No chest wall abnormality. No acute or significant osseous findings. Review of the MIP images confirms the above findings. IMPRESSION: Moderate sized filling defect is noted in lower lobe branch of right pulmonary artery consistent with acute pulmonary embolus. Positive for acute PE with CT evidence of right heart  strain (RV/LV Ratio = 1.5) consistent with at least submassive (intermediate risk) PE. The presence of right heart strain has been associated with an increased risk of morbidity and mortality. Please refer to the "Code PE Focused" order set in EPIC. No evidence of thoracic aortic aneurysm or dissection. Status post coronary artery bypass graft. Electronically Signed: By: Lupita Raider M.D. On: 10/14/2022 09:59      Signature  -   Susa Raring M.D on 10/15/2022 at 8:48 AM   -  To page go to [email or web address]

## 2022-10-15 NOTE — Progress Notes (Signed)
TR[facility] night cross cover note:   I was notified by RN that the patient is c/o headache. I subsequently added prn acetaminophen.     Newton Pigg, DO Hospitalist

## 2022-10-15 NOTE — Progress Notes (Signed)
ANTICOAGULATION CONSULT NOTE  Pharmacy Consult for heparin Indication:  pulmonary embolus, incidental finding on CT   Allergies  Allergen Reactions   Codeine Nausea And Vomiting   Morphine And Codeine Nausea And Vomiting   Lisinopril     Other reaction(s): Cough, sore throat    Patient Measurements: Height: 6' (182.9 cm) Weight: 107 kg (235 lb 14.3 oz) IBW/kg (Calculated) : 77.6 Heparin Dosing Weight: 100 kg  Vital Signs: Temp: 98.4 F (36.9 C) (05/17 2010) Temp Source: Oral (05/17 1736) BP: 115/61 (05/18 0337) Pulse Rate: 71 (05/18 0337)  Labs: Recent Labs    10/14/22 1218 10/14/22 1328 10/14/22 1950 10/15/22 0320  HGB 14.5  --   --  12.8*  HCT 43.0  --   --  37.0*  PLT 179  --   --  147*  HEPARINUNFRC  --   --  0.65 0.72*  CREATININE 1.26*  --   --   --   TROPONINIHS 79* 77*  --   --     Estimated Creatinine Clearance: 64.1 mL/min (A) (by C-G formula based on SCr of 1.26 mg/dL (H)).  Assessment: Patient went for CT for monitoring of known aortic aneurysm and had an incidental finding of PE. CBC ordered. Currently on room air. Not on anticoagulation prior to admission.    Heparin level is above goal at 0.72 on 1600 units/hr. No bleeding noted, Hgb down 12.8, platelets down to 147. No problems with infusion or bleeding per RN.  Goal of Therapy:  Heparin level 0.3-0.7 units/ml Monitor platelets by anticoagulation protocol: Yes   Plan:  Decrease heparin drip to 1500 units/hr Daily heparin level and CBC Monitor for s/sx of bleeding  Thank you for involving pharmacy in this patient's care.  Loura Back, PharmD, BCPS Clinical Pharmacist 10/15/2022 4:46 AM

## 2022-10-15 NOTE — Progress Notes (Signed)
ANTICOAGULATION CONSULT NOTE  Pharmacy Consult for heparin > Eliquis Indication:  pulmonary embolus, incidental finding on CT   Allergies  Allergen Reactions   Codeine Nausea And Vomiting   Morphine And Codeine Nausea And Vomiting   Lisinopril     Other reaction(s): Cough, sore throat    Patient Measurements: Height: 6' (182.9 cm) Weight: 107 kg (235 lb 14.3 oz) IBW/kg (Calculated) : 77.6 Heparin Dosing Weight: 100 kg  Vital Signs: Temp: 97.7 F (36.5 C) (05/18 0800) Temp Source: Oral (05/18 0800) BP: 120/77 (05/18 0800) Pulse Rate: 61 (05/18 0800)  Labs: Recent Labs    10/14/22 1218 10/14/22 1328 10/14/22 1950 10/15/22 0320  HGB 14.5  --   --  12.8*  HCT 43.0  --   --  37.0*  PLT 179  --   --  147*  HEPARINUNFRC  --   --  0.65 0.72*  CREATININE 1.26*  --   --   --   TROPONINIHS 79* 77*  --   --      Estimated Creatinine Clearance: 64.1 mL/min (A) (by C-G formula based on SCr of 1.26 mg/dL (H)).  Assessment: Patient went for CT for monitoring of known aortic aneurysm and had an incidental finding of PE. Currently on room air. Not on anticoagulation prior to admission. Pharmacy consulted to transition from heparin to Eliquis this evening for treatment of PE.  Hgb down 12.8, platelets down to 147. No problems with infusion or bleeding per RN.  Goal of Therapy:  Heparin level 0.3-0.7 units/ml Monitor platelets by anticoagulation protocol: Yes   Plan:  Discontinue heparin this evening Start Eliquis 10mg  PO BID X 7 days, then 5mg  PO BID  Monitor CBC and s/s of bleeding  Thank you for involving pharmacy in this patient's care.  Rennis Petty, PharmD PGY1 Pharmacy Resident 10/15/2022 8:46 AM

## 2022-10-15 NOTE — Progress Notes (Signed)
Lower extremity venous bilateral study completed.  Preliminary results relayed to Singh, MD.  See CV Proc for preliminary results report.   Oriel Rumbold, RDMS, RVT  

## 2022-10-16 DIAGNOSIS — I2699 Other pulmonary embolism without acute cor pulmonale: Secondary | ICD-10-CM | POA: Diagnosis not present

## 2022-10-16 LAB — CBC
HCT: 39.3 % (ref 39.0–52.0)
Hemoglobin: 13.2 g/dL (ref 13.0–17.0)
MCH: 29.9 pg (ref 26.0–34.0)
MCHC: 33.6 g/dL (ref 30.0–36.0)
MCV: 89.1 fL (ref 80.0–100.0)
Platelets: 158 10*3/uL (ref 150–400)
RBC: 4.41 MIL/uL (ref 4.22–5.81)
RDW: 13.9 % (ref 11.5–15.5)
WBC: 7 10*3/uL (ref 4.0–10.5)
nRBC: 0 % (ref 0.0–0.2)

## 2022-10-16 MED ORDER — APIXABAN 5 MG PO TABS: 5.0000 mg | ORAL_TABLET | Freq: Two times a day (BID) | ORAL | 0 refills | Status: DC

## 2022-10-16 MED ORDER — APIXABAN 5 MG PO TABS: 10.0000 mg | ORAL_TABLET | Freq: Two times a day (BID) | ORAL | 0 refills | Status: DC

## 2022-10-16 NOTE — TOC Transition Note (Signed)
Transition of Care [facility]) - CM/SW Discharge Note   Patient Details  Name: CHATO LAATSCH MRN: [id number] Date of Birth: 02-08-1946  [facility]) CM/SW Contact:  Lawerance Sabal, RN Phone Number: 10/16/2022, 8:27 AM   Clinical Narrative:     Spoke with patient and spouse at bedside. Confirmed patient has 30 day Eliquis card and understands how use it.  No other TOC needs identified for DC   Final next level of care: Home/Self Care Barriers to Discharge: No Barriers Identified   Patient Goals and CMS Choice      Discharge Placement                         Discharge Plan and Services Additional resources added to the After Visit Summary for                                       Social Determinants of Health (SDOH) Interventions SDOH Screenings   Food Insecurity: No Food Insecurity (10/14/2022)  Housing: Patient Declined (10/14/2022)  Transportation Needs: No Transportation Needs (10/14/2022)  Utilities: Not At Risk (10/14/2022)  Tobacco Use: Medium Risk (10/14/2022)     Readmission Risk Interventions     No data to display

## 2022-10-16 NOTE — Discharge Instructions (Signed)
Follow with Primary MD Garlan Fillers, MD in 7 days, one-time outpatient follow-up with hematologist of choice to be referred by PCP.  Get CBC, CMP, 2 view Chest X ray -  checked next visit with your primary MD   Activity: As tolerated with Full fall precautions use walker/cane & assistance as needed  Disposition Home     Diet: Heart Healthy    Special Instructions: If you have smoked or chewed Tobacco  in the last 2 yrs please stop smoking, stop any regular Alcohol  and or any Recreational drug use.  On your next visit with your primary care physician please Get Medicines reviewed and adjusted.  Please request your Prim.MD to go over all Hospital Tests and Procedure/Radiological results at the follow up, please get all Hospital records sent to your Prim MD by signing hospital release before you go home.  If you experience worsening of your admission symptoms, develop shortness of breath, life threatening emergency, suicidal or homicidal thoughts you must seek medical attention immediately by calling 911 or calling your MD immediately  if symptoms less severe.  You Must read complete instructions/literature along with all the possible adverse reactions/side effects for all the Medicines you take and that have been prescribed to you. Take any new Medicines after you have completely understood and accpet all the possible adverse reactions/side effects.   Do not drive when taking Pain medications.  Do not take more than prescribed Pain, Sleep and Anxiety Medications

## 2022-10-16 NOTE — Discharge Summary (Signed)
LENNEX ROHM ZOX:[id number] DOB: 12/11/1945 DOA: 10/14/2022  PCP: Garlan Fillers, MD  Admit date: 10/14/2022  Discharge date: 10/16/2022  Admitted From: Home   Disposition:  Home   Recommendations for Outpatient Follow-up:   Follow up with PCP in 1-2 weeks  PCP Please obtain BMP/CBC, 2 view CXR in 1week,  (see Discharge instructions)   PCP Please follow up on the following pending results: Please make 1 time a referral to outpatient hematology for DVT PE.   Home Health: None   Equipment/Devices: None  Consultations: None  Discharge Condition: Stable    CODE STATUS: Full    Diet Recommendation: Heart Healthy   Chief Complaint  Patient presents with   Shortness of Breath     Brief history of present illness from the day of admission and additional interim summary    77 y.o. male with medical history significant of CAD status post CABG, HTN, HLD, with history of aortic artery aneurysm who went for routine screening CTA test for his aneurysm monitoring where the aneurysm was noted to be improved but he was found to have moderate size right lower lobe PE.  He was subsequently sent here for admission.  Apparently he had noticed some exertional shortness of breath walking in his yard for the last few days.                                                                  Hospital Course   Acute right sided subsegmental PE with possible mild strain on echocardiogram, acute left lower extremity DVT. 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  He was kept on heparin drip for 24 hours thereafter has been transition to Eliquis, completely symptom-free and hemodynamically stable,  will be discharged on Eliquis with 1 month coupon.  Future supplies of Eliquis to be arranged by PCP, request PCP to kindly arrange for one-time outpatient hematology/oncology follow-up in the next 2 to 3 weeks.     CAD.  Chest pain-free.  On beta-blocker  and statin for secondary prevention.  Stable echocardiogram with EF 60%.  Aspirin discontinued as he is now on Eliquis.   Dyslipidemia.  On statin.   Hypertension.  Continue beta-blocker, ARB.    Discharge diagnosis     Principal Problem:   Pulmonary embolism (HCC) Active Problems:   CAD (coronary artery disease) of artery bypass graft   HTN (hypertension)   Acute pulmonary embolism (HCC)   Stage 3a chronic kidney disease (HCC)    Discharge instructions    Discharge Instructions     Diet - low sodium heart healthy   Complete by: As directed  Discharge instructions   Complete by: As directed    Patient will be getting 30-day coupon for 1 month supply of Eliquis thereafter per PCP   Increase activity slowly   Complete by: As directed        Discharge Medications   Allergies as of 10/16/2022       Reactions   Codeine Nausea And Vomiting   Morphine And Codeine Nausea And Vomiting   Lisinopril    Other reaction(s): Cough, sore throat        Medication List     STOP taking these medications    aspirin EC 81 MG tablet       TAKE these medications    apixaban 5 MG Tabs tablet Commonly known as: ELIQUIS Take 2 tablets (10 mg total) by mouth 2 (two) times daily for 5 days.   apixaban 5 MG Tabs tablet Commonly known as: ELIQUIS Take 1 tablet (5 mg total) by mouth 2 (two) times daily. Start taking on: Oct 22, 2022   cyanocobalamin 1000 MCG tablet Take 1,000 mcg by mouth daily.   losartan 100 MG tablet Commonly known as: COZAAR Take 100 mg by mouth daily.   metoprolol tartrate 50 MG tablet Commonly known as: LOPRESSOR Take 1 tablet (50 mg total) by mouth 2 (two) times daily.   nitroGLYCERIN 0.4  MG SL tablet Commonly known as: NITROSTAT Place 1 tablet (0.4 mg total) under the tongue every 5 (five) minutes as needed for chest pain.   omeprazole 20 MG tablet Commonly known as: PRILOSEC OTC Take 20 mg by mouth 2 (two) times daily.   simvastatin 40 MG tablet Commonly known as: ZOCOR Take 1 tablet (40 mg total) by mouth at bedtime.   sodium chloride 0.65 % Soln nasal spray Commonly known as: OCEAN Place 1 spray into both nostrils at bedtime.   Systane 0.4-0.3 % Soln Generic drug: Polyethyl Glycol-Propyl Glycol Place 1 drop into both eyes daily as needed (Dry eye).   Vitamin D (Ergocalciferol) 1.25 MG (50000 UNIT) Caps capsule Commonly known as: DRISDOL Take 50,000 Units by mouth once a week.         Follow-up Information     Garlan Fillers, MD. Schedule an appointment as soon as possible for a visit in 1 week(s).   Specialty: Internal Medicine Contact information: [address] [place] [address] [phone]                 Major procedures and Radiology Reports - PLEASE review detailed and final reports thoroughly  -      VAS [place] LOWER EXTREMITY VENOUS (DVT)  Result Date: 10/15/2022  Lower Venous DVT Study Patient Name:  MARKICE HALLIWELL  Date of Exam:   10/15/2022 Medical Rec #: [id number]        Accession #:    [id number] Date of Birth: 29-Jul-1945         Patient Gender: M Patient Age:   77 years Exam Location:  [facility] Procedure:      VAS [place] LOWER EXTREMITY VENOUS (DVT) Referring Phys: Mikey College --------------------------------------------------------------------------------  Indications: Pulmonary embolism.  Anticoagulation: Heparin transitioning to Eliquis. Limitations: Compression maneuvers contraindicated in setting of more proximal LLE findings. Comparison Study: No prior studies. Performing Technologist: Jean Rosenthal RDMS, RVT  Examination Guidelines: A complete evaluation includes B-mode imaging, spectral Doppler, color Doppler, and  power Doppler as needed of all accessible portions of each vessel. Bilateral testing is considered an integral part of a complete examination.  Limited examinations for reoccurring indications may be performed as noted. The reflux portion of the exam is performed with the patient in reverse Trendelenburg.  +---------+---------------+---------+-----------+----------+--------------+ RIGHT    CompressibilityPhasicitySpontaneityPropertiesThrombus Aging +---------+---------------+---------+-----------+----------+--------------+ CFV      Full           Yes      Yes                                 +---------+---------------+---------+-----------+----------+--------------+ SFJ      Full                                                        +---------+---------------+---------+-----------+----------+--------------+ FV Prox  Full                                                        +---------+---------------+---------+-----------+----------+--------------+ FV Mid   Full                                                        +---------+---------------+---------+-----------+----------+--------------+ FV DistalFull                                                        +---------+---------------+---------+-----------+----------+--------------+ PFV      Full                                                        +---------+---------------+---------+-----------+----------+--------------+ POP      Full           Yes      Yes                                 +---------+---------------+---------+-----------+----------+--------------+ PTV      Full                                                        +---------+---------------+---------+-----------+----------+--------------+ PERO     Full                                                        +---------+---------------+---------+-----------+----------+--------------+ Gastroc  Full                                                         +---------+---------------+---------+-----------+----------+--------------+   +---------+---------------+---------+-----------+---------------+--------------+  LEFT     CompressibilityPhasicitySpontaneityProperties     Thrombus Aging +---------+---------------+---------+-----------+---------------+--------------+ CFV      Partial        Yes      Yes        Poorly         Acute                                                      attached,                                                                 mobile - distal                                                           CFV                           +---------+---------------+---------+-----------+---------------+--------------+ SFJ      Full                                                             +---------+---------------+---------+-----------+---------------+--------------+ FV Prox                 Yes      Yes                       Patent by                                                                 color          +---------+---------------+---------+-----------+---------------+--------------+ FV Mid                  Yes      Yes                       Patent by                                                                 color          +---------+---------------+---------+-----------+---------------+--------------+ FV Distal               Yes      Yes  Patent by                                                                 color          +---------+---------------+---------+-----------+---------------+--------------+ PFV                     Yes      Yes                       Patent by                                                                 color          +---------+---------------+---------+-----------+---------------+--------------+ POP      Full           Yes      Yes                                       +---------+---------------+---------+-----------+---------------+--------------+ PTV                     Yes      Yes                       Patent by                                                                 color          +---------+---------------+---------+-----------+---------------+--------------+ PERO                    Yes      Yes                       Patent by                                                                 color          +---------+---------------+---------+-----------+---------------+--------------+    Summary: RIGHT: - There is no evidence of deep vein thrombosis in the lower extremity.  - No cystic structure found in the popliteal fossa.  LEFT: - Findings consistent with acute deep vein thrombosis involving the left common femoral vein. - No cystic structure found in the popliteal fossa.  *See table(s) above for measurements and observations.    Preliminary    ECHOCARDIOGRAM COMPLETE  Result Date: 10/14/2022    ECHOCARDIOGRAM REPORT  Patient Name:   NEYTHAN CAPERTON Date of Exam: 10/14/2022 Medical Rec #:  [id number]       Height:       72.0 in Accession #:    [id number]      Weight:       235.9 lb Date of Birth:  April 14, 1946        BSA:          2.285 m Patient Age:    77 years        BP:           122/65 mmHg Patient Gender: M               HR:           64 bpm. Exam Location:  Inpatient Procedure: 2D Echo, Cardiac Doppler, Color Doppler and Intracardiac            Opacification Agent Indications:    Pulmonary Embolism  History:        Patient has prior history of Echocardiogram examinations, most                 recent 07/27/2021. CAD, Prior CABG, Signs/Symptoms:Edema and                 Shortness of Breath; Risk Factors:Hypertension and Former                 Smoker.  Sonographer:    Darlys Gales Referring Phys: [id number] ADAM CURATOLO IMPRESSIONS  1. Left ventricular ejection fraction, by estimation, is 60 to 65%. The left  ventricle has normal function. The left ventricle has no regional wall motion abnormalities. Left ventricular diastolic parameters are consistent with Grade I diastolic dysfunction (impaired relaxation).  2. The RV is not well visualized but in limited images it does appear dilated and at least mildly hypokinetic indicative of RV strain. Right ventricular systolic function is mildly reduced. The right ventricular size is moderately enlarged. Tricuspid regurgitation signal is inadequate for assessing PA pressure.  3. Right atrial size was mildly dilated.  4. The mitral valve is normal in structure. No evidence of mitral valve regurgitation. No evidence of mitral stenosis.  5. The aortic valve is tricuspid. There is mild calcification of the aortic valve. Aortic valve regurgitation is not visualized. Aortic valve sclerosis/calcification is present, without any evidence of aortic stenosis. Aortic valve area, by VTI measures  3.13 cm. Aortic valve mean gradient measures 4.0 mmHg. Aortic valve Vmax measures 1.36 m/s. FINDINGS  Left Ventricle: Left ventricular ejection fraction, by estimation, is 60 to 65%. The left ventricle has normal function. The left ventricle has no regional wall motion abnormalities. Definity contrast agent was given IV to delineate the left ventricular  endocardial borders. The left ventricular internal cavity size was normal in size. There is no left ventricular hypertrophy. Left ventricular diastolic parameters are consistent with Grade I diastolic dysfunction (impaired relaxation). Right Ventricle: The RV is not well visualized but in limited images it does appear dilated and at least mildly hypokinetic indicative of RV strain. The right ventricular size is moderately enlarged. No increase in right ventricular wall thickness. Right  ventricular systolic function is mildly reduced. Tricuspid regurgitation signal is inadequate for assessing PA pressure. Left Atrium: Left atrial size was normal  in size. Right Atrium: Right atrial size was mildly dilated. Pericardium: There is no evidence of pericardial effusion. Mitral Valve: The mitral valve is normal in structure. No evidence of mitral valve regurgitation. No evidence of  mitral valve stenosis. Tricuspid Valve: The tricuspid valve is normal in structure. Tricuspid valve regurgitation is trivial. No evidence of tricuspid stenosis. Aortic Valve: The aortic valve is tricuspid. There is mild calcification of the aortic valve. Aortic valve regurgitation is not visualized. Aortic valve sclerosis/calcification is present, without any evidence of aortic stenosis. Aortic valve mean gradient measures 4.0 mmHg. Aortic valve peak gradient measures 7.4 mmHg. Aortic valve area, by VTI measures 3.13 cm. Pulmonic Valve: The pulmonic valve was normal in structure. Pulmonic valve regurgitation is trivial. No evidence of pulmonic stenosis. Aorta: The aortic root is normal in size and structure. Venous: The inferior vena cava was not well visualized. IAS/Shunts: No atrial level shunt detected by color flow Doppler.  LEFT VENTRICLE PLAX 2D LVIDd:         4.90 cm   Diastology LVIDs:         2.80 cm   LV e' medial:    6.42 cm/s LV PW:         1.20 cm   LV E/e' medial:  9.0 LV IVS:        0.90 cm   LV e' lateral:   11.10 cm/s LVOT diam:     2.10 cm   LV E/e' lateral: 5.2 LV SV:         84 LV SV Index:   37 LVOT Area:     3.46 cm  RIGHT VENTRICLE RV S prime:     12.40 cm/s TAPSE (M-mode): 2.2 cm LEFT ATRIUM             Index        RIGHT ATRIUM           Index LA Vol (A2C):   84.4 ml 36.93 ml/m  RA Area:     26.20 cm LA Vol (A4C):   66.1 ml 28.92 ml/m  RA Volume:   94.50 ml  41.35 ml/m LA Biplane Vol: 75.8 ml 33.17 ml/m  AORTIC VALVE AV Area (Vmax):    2.65 cm AV Area (Vmean):   3.06 cm AV Area (VTI):     3.13 cm AV Vmax:           136.00 cm/s AV Vmean:          91.000 cm/s AV VTI:            0.269 m AV Peak Grad:      7.4 mmHg AV Mean Grad:      4.0 mmHg LVOT Vmax:          104.00 cm/s LVOT Vmean:        80.300 cm/s LVOT VTI:          0.243 m LVOT/AV VTI ratio: 0.90 MITRAL VALVE                TRICUSPID VALVE MV Area (PHT): 1.83 cm     TR Peak grad:   15.5 mmHg MV Decel Time: 414 msec     TR Vmax:        197.00 cm/s MV E velocity: 57.90 cm/s MV A velocity: 112.00 cm/s  SHUNTS MV E/A ratio:  0.52         Systemic VTI:  0.24 m                             Systemic Diam: 2.10 cm Arvilla Meres MD Electronically signed by Arvilla Meres MD Signature Date/Time: 10/14/2022/2:44:23 PM  Final    DG Chest 2 View  Result Date: 10/14/2022 CLINICAL DATA:  Shortness of breath. EXAM: CHEST - 2 VIEW COMPARISON:  January 27, 2019. FINDINGS: The heart size and mediastinal contours are within normal limits. Status post coronary artery bypass graft. Both lungs are clear. The visualized skeletal structures are unremarkable. IMPRESSION: No acute radiographic cardiopulmonary disease. Pulmonary embolus is noted on CT scan of same day. Electronically Signed   By: Lupita Raider M.D.   On: 10/14/2022 12:02   CT ANGIO CHEST AORTA W/CM & OR WO/CM  Addendum Date: 10/14/2022   ADDENDUM REPORT: 10/14/2022 10:11 ADDENDUM: Critical Value/emergent results were called by telephone at the time of interpretation on 10/14/2022 at 10:11 am to provider Bryan Lemma, who verbally acknowledged these results. Electronically Signed   By: Lupita Raider M.D.   On: 10/14/2022 10:11   Result Date: 10/14/2022 CLINICAL DATA:  Aortic aneurysm. EXAM: CT ANGIOGRAPHY CHEST WITH CONTRAST TECHNIQUE: Multidetector CT imaging of the chest was performed using the standard protocol during bolus administration of intravenous contrast. Multiplanar CT image reconstructions and MIPs were obtained to evaluate the vascular anatomy. RADIATION DOSE REDUCTION: This exam was performed according to the departmental dose-optimization program which includes automated exposure control, adjustment of the mA and/or kV according to patient  size and/or use of iterative reconstruction technique. CONTRAST:  50mL OMNIPAQUE IOHEXOL 350 MG/ML SOLN COMPARISON:  None Available. FINDINGS: Cardiovascular: Status post coronary artery bypass graft. There is no evidence of thoracic aortic aneurysm or dissection. Normal cardiac size. No pericardial effusion. Filling defect is noted in lower lobe branch of right pulmonary artery consistent with acute pulmonary embolus. RV/LV ratio of 1.5 is noted suggesting right heart strain. Mediastinum/Nodes: No enlarged mediastinal, hilar, or axillary lymph nodes. Thyroid gland, trachea, and esophagus demonstrate no significant findings. Lungs/Pleura: Lungs are clear. No pleural effusion or pneumothorax. Upper Abdomen: No acute abnormality. Musculoskeletal: No chest wall abnormality. No acute or significant osseous findings. Review of the MIP images confirms the above findings. IMPRESSION: Moderate sized filling defect is noted in lower lobe branch of right pulmonary artery consistent with acute pulmonary embolus. Positive for acute PE with CT evidence of right heart strain (RV/LV Ratio = 1.5) consistent with at least submassive (intermediate risk) PE. The presence of right heart strain has been associated with an increased risk of morbidity and mortality. Please refer to the "Code PE Focused" order set in EPIC. No evidence of thoracic aortic aneurysm or dissection. Status post coronary artery bypass graft. Electronically Signed: By: Lupita Raider M.D. On: 10/14/2022 09:59   LONG TERM MONITOR (3-14 DAYS)  Result Date: 09/27/2022   Normal sinus rhythm   Frequent PACs,PAC couplets and short runs of PAT. longest 10 beats. Slowest HR of 33 is due to NSR wiht blocked PACs in bigeminal pattern   Rare PVCs   No prolonged pauses   No Afib Patch Wear Time:  13 days and 0 hours (2024-04-08T19:49:46-0400 to 2024-04-21T20:09:03-399) Patient had a min HR of 33 bpm, max HR of 171 bpm, and avg HR of 63 bpm. Predominant underlying rhythm  was Sinus Rhythm. 316 Supraventricular Tachycardia runs occurred, the run with the fastest interval lasting 6 beats with a max rate of 171 bpm, the longest lasting 10 beats with an avg rate of 104 bpm. Some episodes of Supraventricular Tachycardia may be possible Atrial Tachycardia with variable block. Isolated SVEs were frequent (9.0%, [id number]), SVE Couplets were occasional (2.9%, 17486), and SVE Triplets were rare (<1.0%,  2951). Isolated VEs were rare (<1.0%), VE Couplets were rare (<1.0%), and no VE Triplets were present. MD notification criteria for Symptomatic Bradycardia met - report posted prior to notification per account request (LD).    Micro Results    No results found for this or any previous visit (from the past 240 hour(s)).  Today   Subjective    Righteous Pandolfo today has no headache,no chest abdominal pain,no new weakness tingling or numbness, feels much better wants to go home today.    Objective   Blood pressure 119/70, pulse (!) 53, temperature 98.2 F (36.8 C), temperature source Oral, resp. rate 18, height 6' (1.829 m), weight 107 kg, SpO2 93 %.   Intake/Output Summary (Last 24 hours) at 10/16/2022 0754 Last data filed at 10/15/2022 1625 Gross per 24 hour  Intake 70.25 ml  Output --  Net 70.25 ml    Exam  Awake Alert, No new F.N deficits,    [place].AT,PERRAL Supple Neck,   Symmetrical Chest wall movement, Good air movement bilaterally, CTAB RRR,No Gallops,   +ve B.Sounds, Abd Soft, Non tender,  No Cyanosis, Clubbing or edema    Data Review   Recent Labs  Lab 10/14/22 1218 10/15/22 0320 10/16/22 0337  WBC 6.9 7.2 7.0  HGB 14.5 12.8* 13.2  HCT 43.0 37.0* 39.3  PLT 179 147* 158  MCV 90.3 88.1 89.1  MCH 30.5 30.5 29.9  MCHC 33.7 34.6 33.6  RDW 13.7 13.9 13.9    Recent Labs  Lab 10/14/22 1218 10/14/22 1258  NA 140  --   K 3.8  --   CL 110  --   CO2 23  --   ANIONGAP 7  --   GLUCOSE 154*  --   BUN 21  --   CREATININE 1.26*  --   LATICACIDVEN   --  1.0  BNP 142.2*  --   CALCIUM 8.8*  --     Total Time in preparing paper work, data evaluation and todays exam - 35 minutes  Signature  -    Susa Raring M.D on 10/16/2022 at 7:54 AM   -  To page go to [email or web address]

## 2022-10-20 ENCOUNTER — Telehealth: Payer: Self-pay

## 2022-10-20 DIAGNOSIS — R001 Bradycardia, unspecified: Secondary | ICD-10-CM

## 2022-10-20 DIAGNOSIS — I471 Supraventricular tachycardia, unspecified: Secondary | ICD-10-CM

## 2022-10-20 NOTE — Telephone Encounter (Signed)
Noted  

## 2022-10-20 NOTE — Telephone Encounter (Signed)
Spoke with pt to discuss monitor results. Pt voiced understanding. Referral sent to EP for symptomatic bradycardia and SVT.

## 2022-11-15 ENCOUNTER — Ambulatory Visit: Payer: Medicare Other | Attending: Internal Medicine | Admitting: Internal Medicine

## 2022-11-15 VITALS — BP 112/70 | HR 66 | Ht 72.0 in | Wt 231.2 lb

## 2022-11-15 DIAGNOSIS — R002 Palpitations: Secondary | ICD-10-CM

## 2022-11-15 NOTE — Progress Notes (Signed)
HPI Kyle Baldwin is referred by [name] for evaluation of NS SVT and blocked PAC's. He is a pleasant 77 yo man with CAD, s/p CABG over 17 years ago, with PCI of an OM in 2023. He has palpitations and wore a Zio monitor which demonstrated up to 10 beats of SVT and PAC's and PVC's. He has had a pulmonary embolism in the interim, apparently unprovoked. He has not had syncope though he notes that when he had the embolism, he almost passed out. He denies anginal symptoms.  Allergies  Allergen Reactions   Codeine Nausea And Vomiting   Morphine And Codeine Nausea And Vomiting   Lisinopril     Other reaction(s): Cough, sore throat     Current Outpatient Medications  Medication Sig Dispense Refill   apixaban (ELIQUIS) 5 MG TABS tablet Take 1 tablet (5 mg total) by mouth 2 (two) times daily. 60 tablet 0   cyanocobalamin 1000 MCG tablet Take 1,000 mcg by mouth daily.      ipratropium (ATROVENT) 0.06 % nasal spray Place 1 spray into both nostrils as needed.     losartan (COZAAR) 100 MG tablet Take 100 mg by mouth daily.      nitroGLYCERIN (NITROSTAT) 0.4 MG SL tablet Place 1 tablet (0.4 mg total) under the tongue every 5 (five) minutes as needed for chest pain. 25 tablet 6   omeprazole (PRILOSEC OTC) 20 MG tablet Take 20 mg by mouth 2 (two) times daily.     Polyethyl Glycol-Propyl Glycol (SYSTANE) 0.4-0.3 % SOLN Place 1 drop into both eyes daily as needed (Dry eye).     simvastatin (ZOCOR) 40 MG tablet Take 1 tablet (40 mg total) by mouth at bedtime. 90 tablet 3   sodium chloride (OCEAN) 0.65 % SOLN nasal spray Place 1 spray into both nostrils at bedtime.     Vitamin D, Ergocalciferol, (DRISDOL) 1.25 MG (50000 UNIT) CAPS capsule Take 50,000 Units by mouth once a week.     metoprolol tartrate (LOPRESSOR) 50 MG tablet Take 1 tablet (50 mg total) by mouth 2 (two) times daily. 180 tablet 3   No current facility-administered medications for this visit.     Past Medical History:  Diagnosis  Date   Bilateral carpal tunnel syndrome    Bladder cancer (HCC) 2005   bladder cancer in situ 2005   CAD (coronary artery disease)    Diverticulosis    Fatty liver    GERD (gastroesophageal reflux disease)    Hyperlipidemia    Hypertension    Kidney stones    Liver cyst right   hepatic lobe cyst   Myocardial infarction [facility]) 2007   heart bypass 2007   Vitamin B 12 deficiency     ROS:   All systems reviewed and negative except as noted in the HPI.   Past Surgical History:  Procedure Laterality Date   BLADDER SURGERY  2005   CORONARY STENT INTERVENTION N/A 08/12/2021   Procedure: CORONARY STENT INTERVENTION;  Surgeon: Swaziland, Peter M, MD;  Location: [facility] INVASIVE CV LAB;  Service: Cardiovascular;  Laterality: N/A;   heart bypass  2007   3 vessels   LEFT HEART CATH AND CORS/GRAFTS ANGIOGRAPHY N/A 08/12/2021   Procedure: LEFT HEART CATH AND CORS/GRAFTS ANGIOGRAPHY;  Surgeon: Swaziland, Peter M, MD;  Location: [facility] INVASIVE CV LAB;  Service: Cardiovascular;  Laterality: N/A;   LUMBAR LAMINECTOMY       Family History  Problem Relation Age of Onset   Colon  cancer Maternal Grandfather 55   Diabetes Maternal Grandmother    Heart disease Maternal Grandmother    Stomach cancer Neg Hx      Social History   Socioeconomic History   Marital status: Married    Spouse name: Not on file   Number of children: 2   Years of education: Not on file   Highest education level: Not on file  Occupational History   Occupation: retired- Conservation officer, historic buildings: city Mining engineer supply  Tobacco Use   Smoking status: Former    Types: Cigarettes    Quit date: 05/30/1968    Years since quitting: 54.4   Smokeless tobacco: Never  Vaping Use   Vaping Use: Never used  Substance and Sexual Activity   Alcohol use: No   Drug use: No   Sexual activity: Not on file  Other Topics Concern   Not on file  Social History Narrative   Not on file   Social Determinants of Health   Financial Resource  Strain: Not on file  Food Insecurity: No Food Insecurity (10/14/2022)   Hunger Vital Sign    Worried About Running Out of Food in the Last Year: Never true    Ran Out of Food in the Last Year: Never true  Transportation Needs: No Transportation Needs (10/14/2022)   PRAPARE - Administrator, Civil Service (Medical): No    Lack of Transportation (Non-Medical): No  Physical Activity: Not on file  Stress: Not on file  Social Connections: Not on file  Intimate Partner Violence: Not At Risk (10/14/2022)   Humiliation, Afraid, Rape, and Kick questionnaire    Fear of Current or Ex-Partner: No    Emotionally Abused: No    Physically Abused: No    Sexually Abused: No     BP 112/70   Pulse 66   Ht 6' (1.829 m)   Wt 231 lb 3.2 oz (104.9 kg)   SpO2 96%   BMI 31.36 kg/m   Physical Exam:  Well appearing NAD HEENT: Unremarkable Neck:  No JVD, no thyromegally Lymphatics:  No adenopathy Back:  No CVA tenderness Lungs:  Clear HEART:  Regular rate rhythm, no murmurs, no rubs, no clicks Abd:  soft, positive bowel sounds, no organomegally, no rebound, no guarding Ext:  2 plus pulses, no edema, no cyanosis, no clubbing Skin:  No rashes no nodules Neuro:  CN II through XII intact, motor grossly intact  EKG - NSR  DEVICE  Normal device function.  See PaceArt for details.   Assess/Plan: Palpitations - I have reviewed his Zio monitor. I encouraged him to avoid caffeine and ETOH and to get plenty of sleep. There is no good AA treatment in his case as medications that are safe, have too many side effects. If he has a bad day, I asked him to try taking an extra metoprolol. CAD - He denies anginal symptoms.  HTN - His bp is well controlled on medical therapy.  Sharlot Gowda Maryetta Shafer,MD

## 2022-11-15 NOTE — Patient Instructions (Signed)
Medication Instructions:  Your physician recommends that you continue on your current medications as directed. Please refer to the Current Medication list given to you today.  *If you need a refill on your cardiac medications before your next appointment, please call your pharmacy*   Lab Work: If you have labs (blood work) drawn today and your tests are completely normal, you will receive your results only by: MyChart Message (if you have MyChart) OR A paper copy in the mail If you have any lab test that is abnormal or we need to change your treatment, we will call you to review the results.   Testing/Procedures: None ordered today.  Follow-Up: At [facility], you and your health needs are our priority.  As part of our continuing mission to provide you with exceptional heart care, we have created designated Provider Care Teams.  These Care Teams include your primary Cardiologist (physician) and Advanced Practice Providers (APPs -  Physician Assistants and Nurse Practitioners) who all work together to provide you with the care you need, when you need it.  We recommend signing up for the patient portal called "MyChart".  Sign up information is provided on this After Visit Summary.  MyChart is used to connect with patients for Virtual Visits (Telemedicine).  Patients are able to view lab/test results, encounter notes, upcoming appointments, etc.  Non-urgent messages can be sent to your provider as well.   To learn more about what you can do with MyChart, go to [email or web address].    Your next appointment:   As needed  Provider:   You may see Lewayne Bunting, MD

## 2023-01-09 NOTE — Progress Notes (Unsigned)
Cardiology Office Note   Date:  01/12/2023   ID:  Kyle, Baldwin 1945/07/18, MRN [id number]  PCP:  Garlan Fillers, MD  Cardiologist:    [place], MD   No chief complaint on file.     History of Present Illness: Kyle Baldwin is a 77 y.o. male who is seen for follow up post cardiac cath. He has a history of CAD s/p CABG in 2007 by Dr. Tyrone Sage. This included a sequential SVG to the PDA and PLOM and LIMA to the LAD.  He also has a history of HTN and Hyperlipidemia.   He was seen in the ED on 12//31/19 with an episode of SOB. States he awoke suddenly from sleep and couldn't breathe. Felt like his throat was closing up. Lasted about 5 minutes. No tongue swelling.  Symptoms resolved. CXR and Ecg were OK. Labs remarkable for creatinine 1.28.   He was seen in the ED in August 2020 with vertigo. Evaluation was negative.   He was diagnosed with COVID 19 in September 2021. Received monoclonal antibodies. Subsequently complained of palpitations. Event monitor placed. Event monitor showed rare PACs and PVCs. <1%. 4 brief runs of SVT longest 6 beats. No therapy recommended.   When seen recently he noted that since the summer he has noted more palpitations. Notes HR is faster and he has more forceful beats. He feels lightheaded with this and has chest pressure. Notes more ankle swelling L>R. He also notes chest pressure is worse after eating. We stopped his amlodipine and increased his metoprolol dose. Echo was ordered and showed a proximal aortic aneurysm at 4.4 cm otherwise normal. On follow up today  he notes significant improvement in his palpitations with the increased metoprolol but is still experiencing the chest pressure. Echo showed normal LV and valvular function. Subsequent cardiac cath showed patent grafts to the LAD and RCA. There was a significant stenosis in OM1. This was treated with balloon angioplasty. Unable to deliver a stent due to significant angulation and tortuosity  of vessel. Was DC on ASA and Plavix for one month.  He was seen in early April with complaints of palpitations. He had an event monitor placed which showed frequent PVCs, occ PACs, runs of SVT and bradycardia. He was seen by Dr Ladona Ridgel in June who felt there were no good medical options for him and recommended conservative management.   He was admitted in May. He had a CT chest to follow up on mild aortic enlargement and was found to have acute PE. Unprovoked. Also had left LE DVT. He did have evidence of RV strain on CT and Echo. LV function was normal. Was initially placed on heparin then transitioned to Eliquis.  On follow up today he is feeling well. Never had any chest pain or SOB with his PE. Notes his palpitations have improved significantly. He stays busy caring for 5 grand daughters.    Past Medical History:  Diagnosis Date   Bilateral carpal tunnel syndrome    Bladder cancer (HCC) 2005   bladder cancer in situ 2005   CAD (coronary artery disease)    Diverticulosis    Fatty liver    GERD (gastroesophageal reflux disease)    Hyperlipidemia    Hypertension    Kidney stones    Liver cyst right   hepatic lobe cyst   Myocardial infarction [facility]) 2007   heart bypass 2007   Vitamin B 12 deficiency     Past Surgical History:  Procedure Laterality Date   BLADDER SURGERY  2005   CORONARY STENT INTERVENTION N/A 08/12/2021   Procedure: CORONARY STENT INTERVENTION;  Surgeon: Swaziland,  M, MD;  Location: [facility] INVASIVE CV LAB;  Service: Cardiovascular;  Laterality: N/A;   heart bypass  2007   3 vessels   LEFT HEART CATH AND CORS/GRAFTS ANGIOGRAPHY N/A 08/12/2021   Procedure: LEFT HEART CATH AND CORS/GRAFTS ANGIOGRAPHY;  Surgeon: [place]azil[place],  M, MD;  Location: [facility] INVASIVE CV LAB;  Service: Cardiovascular;  Laterality: N/A;   LUMBAR LAMINECTOMY       Current Outpatient Medications  Medication Sig Dispense Refill   apixaban (ELIQUIS) 5 MG TABS tablet Take 1 tablet (5 mg total) by  mouth 2 (two) times daily. 60 tablet 0   cyanocobalamin 1000 MCG tablet Take 1,000 mcg by mouth daily.      ipratropium (ATROVENT) 0.06 % nasal spray Place 1 spray into both nostrils as needed.     losartan (COZAAR) 100 MG tablet Take 100 mg by mouth daily.      nitroGLYCERIN (NITROSTAT) 0.4 MG SL tablet Place 1 tablet (0.4 mg total) under the tongue every 5 (five) minutes as needed for chest pain. 25 tablet 6   omeprazole (PRILOSEC OTC) 20 MG tablet Take 20 mg by mouth 2 (two) times daily.     Polyethyl Glycol-Propyl Glycol (SYSTANE) 0.4-0.3 % SOLN Place 1 drop into both eyes daily as needed (Dry eye).     simvastatin (ZOCOR) 40 MG tablet Take 1 tablet (40 mg total) by mouth at bedtime. 90 tablet 3   sodium chloride (OCEAN) 0.65 % SOLN nasal spray Place 1 spray into both nostrils at bedtime.     Vitamin D, Ergocalciferol, (DRISDOL) 1.25 MG (50000 UNIT) CAPS capsule Take 50,000 Units by mouth once a week.     metoprolol tartrate (LOPRESSOR) 50 MG tablet Take 1 tablet (50 mg total) by mouth 2 (two) times daily. 180 tablet 3   No current facility-administered medications for this visit.    Allergies:   Codeine, Morphine and codeine, and Lisinopril    Social History:  The patient  reports that he quit smoking about 54 years ago. His smoking use included cigarettes. He has never used smokeless tobacco. He reports that he does not drink alcohol and does not use drugs.   Family History:  The patient's family history includes Colon cancer (age of onset: 64) in his maternal grandfather; Diabetes in his maternal grandmother; Heart disease in his maternal grandmother.    ROS:  Please see the history of present illness.   Otherwise, review of systems are positive for none.   All other systems are reviewed and negative.    PHYSICAL EXAM: VS:  BP 110/62   Pulse (!) 55   Ht 6' (1.829 m)   Wt 239 lb 6.4 oz (108.6 kg)   SpO2 96%   BMI 32.47 kg/m  , BMI Body mass index is 32.47 kg/m. GENERAL:  Well  appearing overweight WM in NAD. HEENT:  PERRL, EOMI, sclera are clear. Oropharynx is clear. NECK:  No jugular venous distention, carotid upstroke brisk and symmetric, no bruits, no thyromegaly or adenopathy LUNGS:  Clear to auscultation bilaterally CHEST:  Unremarkable HEART:  RRR,  PMI not displaced or sustained,S1 and S2 within normal limits, no S3, no S4: no clicks, no rubs, no murmurs ABD:  Soft, nontender. BS +, no masses or bruits. No hepatomegaly, no splenomegaly EXT:  2 + pulses throughout, no pitting edema, some chronic venous  stasis. SKIN:  Warm and dry.  No rashes NEURO:  Alert and oriented x 3. Cranial nerves II through XII intact. PSYCH:  Cognitively intact    EKG:  EKG is  not ordered today.      Recent Labs: 09/30/2022: ALT 12; Magnesium 2.1 10/14/2022: B Natriuretic Peptide 142.2; BUN 21; Creatinine, Ser 1.26; Potassium 3.8; Sodium 140 10/16/2022: Hemoglobin 13.2; Platelets 158    Lipid Panel    Component Value Date/Time   CHOL 120 09/30/2022 0820   TRIG 105 09/30/2022 0820   HDL 33 (L) 09/30/2022 0820   CHOLHDL 3.6 09/30/2022 0820   LDLCALC 67 09/30/2022 0820      Wt Readings from Last 3 Encounters:  01/12/23 239 lb 6.4 oz (108.6 kg)  11/15/22 231 lb 3.2 oz (104.9 kg)  10/14/22 235 lb 14.3 oz (107 kg)     Labs dated 02/05/16: cholesterol 126, triglycerides 91, LDL 74, HDL 34. A1c 5.2%. CMET, TSH, CBC normal Dated 02/09/17: cholesterol 130, triglycerides 111, HDL 33, LDL 75. A1c 5.2%. Creatinine 1.2. CBC and ALT normal. Dated 03/09/18: cholesterol 116, triglycerides 86, HDL 27. LDL 72. ALT normal. A1c 5.3%.  Dated 05/29/18: creatinine 1.28. CBC normal.  Dated 03/15/19: cholesterol 136, triglycerides 100, HDL 34, LDL 82. A1c 5.3%. creatinine 1.2. Otherwise CMET and CBC normal Dated 03/20/20: cholesterol 111, triglycerides 82., HDL 34, LDL 61. A1c 5.2%. BUN 20, creatinine 1.2. otherwise CMET and CBC normal. Dated 04/05/21: cholesterol 119, triglycerides 70, HDL  37, LDL 68. A1c 5.4%. potassium normal. ALT normal. Dated 01/17/22: cholesterol 131, triglycerides 86, HDL 33, LDL 81. A1c 5.4%.   Other studies Reviewed: Additional studies/ records that were reviewed today include: Myoview 06/23/15: Review of the above records demonstrates: Study Highlights   Nuclear stress EF: 51%. The left ventricular ejection fraction is mildly decreased (45-54%). There was no ST segment deviation noted during stress. No T wave inversion was noted during stress. Defect 1: There is a small defect of mild severity present in the basal inferior location. This is a low risk study.   Low risk stress nuclear study with a small fixed basal inferior fixed defect (small scar versus diaphragmatic attenuation artifact). No reversible ischemia is seen. Borderline left ventricular global systolic function   Event monitor. 03/19/20:Study Highlights  Normal sinus rhythm Rare PACs and PVCs Few brief runs of SVT less than 6 beats.   Echo 07/27/21: IMPRESSIONS     1. Left ventricular ejection fraction, by estimation, is 60 to 65%. The  left ventricle has normal function. The left ventricle has no regional  wall motion abnormalities. Left ventricular diastolic parameters are  consistent with Grade I diastolic  dysfunction (impaired relaxation). The average left ventricular global  longitudinal strain is -24.8 %. The global longitudinal strain is normal.   2. Right ventricular systolic function was not well visualized. The right  ventricular size is normal. Tricuspid regurgitation signal is inadequate  for assessing PA pressure.   3. The mitral valve is grossly normal. No evidence of mitral valve  regurgitation.   4. The aortic valve is grossly normal. There is mild calcification of the  aortic valve. Aortic valve regurgitation is not visualized.   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   6. The inferior vena cava is normal in size with greater than 50%  respiratory  variability, suggesting right atrial pressure of 3 mmHg.   Conclusion(s)/Recommendation(s): Otherwise normal echocardiogram, with  minor abnormalities described in the report. Recommend annual surveillance  for aortic root  aneurysm.    Cardiac cath 08/12/21:  CORONARY STENT INTERVENTION  LEFT HEART CATH AND CORS/GRAFTS ANGIOGRAPHY   Conclusion      Dist RCA lesion is 100% stenosed.   Mid LAD lesion is 80% stenosed.   Mid LAD to Dist LAD lesion is 90% stenosed.   1st Diag lesion is 60% stenosed.   1st Mrg lesion is 90% stenosed.   Balloon angioplasty was performed using a BALLN SAPPHIRE 2.5X12.   Post intervention, there is a 25% residual stenosis.   SVG graft was visualized by angiography and is normal in caliber.   LIMA graft was visualized by angiography and is normal in caliber.   The graft exhibits no disease.   The graft exhibits no disease.   The left ventricular systolic function is normal.   LV end diastolic pressure is normal.   The left ventricular ejection fraction is 55-65% by visual estimate.   3 vessel obstructive CAD      - diffuse 90 % mid to distal LAD. 65% proximal first diagonal      - 90% first OM      - 100% distal RCA 2.   Patent LIMA to the LAD 3.   Patent SVG to PDA/PL 4.   Normal LV function 5.   Normal LVEDP 6.   Successful POBA of the first OM. Unable to deliver stent due to vessel tortuosity.     Plan: would continue DAPT with ASA and Plavix for one month. Monitor clinically. If patient has restenosis of the OM would need to consider more aggressive approach from femoral artery. Would start with a 4 cm guide and consider heavy duty wire. Theron Arista support offered little assistance.   Diagnostic Dominance: Right Intervention    Event monitor 09/26/22: Study Highlights      Normal sinus rhythm   Frequent PACs,PAC couplets and short runs of PAT. longest 10 beats. Slowest HR of 33 is due to NSR wiht blocked PACs in bigeminal pattern   Rare  PVCs   No prolonged pauses   No Afib     Patch Wear Time:  13 days and 0 hours (2024-04-08T19:49:46-0400 to 2024-04-21T20:09:03-399)  Echo 10/14/22: IMPRESSIONS     1. Left ventricular ejection fraction, by estimation, is 60 to 65%. The  left ventricle has normal function. The left ventricle has no regional  wall motion abnormalities. Left ventricular diastolic parameters are  consistent with Grade I diastolic  dysfunction (impaired relaxation).   2. The RV is not well visualized but in limited images it does appear  dilated and at least mildly hypokinetic indicative of RV strain. Right  ventricular systolic function is mildly reduced. The right ventricular  size is moderately enlarged. Tricuspid  regurgitation signal is inadequate for assessing PA pressure.   3. Right atrial size was mildly dilated.   4. The mitral valve is normal in structure. No evidence of mitral valve  regurgitation. No evidence of mitral stenosis.   5. The aortic valve is tricuspid. There is mild calcification of the  aortic valve. Aortic valve regurgitation is not visualized. Aortic valve  sclerosis/calcification is present, without any evidence of aortic  stenosis. Aortic valve area, by VTI measures   3.13 cm. Aortic valve mean gradient measures 4.0 mmHg. Aortic valve Vmax  measures 1.36 m/s.   ASSESSMENT AND PLAN:  1.  CAD s/p CABG 2007. Myoview Jan 2017 was low risk. Evaluated last year for chest pain. LV function normal  on Echo. Cardiac cath March 2023  showed patent LIMA to the LAD and SVG to the RCA. Significant stenosis in OM1 treated with POBA. Unable to deliver stent due to vessel angulation/tortuosity. Has stable  angina class 1. Improved. Continue current therapy. Current symptoms more c/w GERD.   2. HTN controlled.  3. Hyperlipidemia.  on Zocor.  Last LDL 67.  4. Palpitations/tachycardia.   Symptoms improved. I wonder whether this was related to his PE without other clinical symptoms  5. No  thoracic aneurysm seen on CT. Previously noted mild enlargement on Echo. No further follow up needed.   6. Acute PE/left LE DVT. Now on Eliquis. Unprovoked.   Disposition: follow up in 6 months.  Signed,  [place], MD  01/12/2023 8:37 AM    [facility] [address], [place], [place], [address] Phone [phone], Fax [phone]

## 2023-01-12 ENCOUNTER — Ambulatory Visit: Payer: Medicare Other | Admitting: Cardiology

## 2023-01-12 ENCOUNTER — Encounter: Payer: Self-pay | Admitting: Cardiology

## 2023-01-12 VITALS — BP 110/62 | HR 55 | Ht 72.0 in | Wt 239.4 lb

## 2023-01-12 DIAGNOSIS — I471 Supraventricular tachycardia, unspecified: Secondary | ICD-10-CM

## 2023-01-12 DIAGNOSIS — E7800 Pure hypercholesterolemia, unspecified: Secondary | ICD-10-CM

## 2023-01-12 DIAGNOSIS — I2510 Atherosclerotic heart disease of native coronary artery without angina pectoris: Secondary | ICD-10-CM

## 2023-01-12 DIAGNOSIS — I2699 Other pulmonary embolism without acute cor pulmonale: Secondary | ICD-10-CM

## 2023-01-12 NOTE — Patient Instructions (Signed)
Medication Instructions:  Continue same medications *If you need a refill on your cardiac medications before your next appointment, please call your pharmacy*   Lab Work: None ordered   Testing/Procedures: None ordered   Follow-Up: At [facility], you and your health needs are our priority.  As part of our continuing mission to provide you with exceptional heart care, we have created designated Provider Care Teams.  These Care Teams include your primary Cardiologist (physician) and Advanced Practice Providers (APPs -  Physician Assistants and Nurse Practitioners) who all work together to provide you with the care you need, when you need it.  We recommend signing up for the patient portal called "MyChart".  Sign up information is provided on this After Visit Summary.  MyChart is used to connect with patients for Virtual Visits (Telemedicine).  Patients are able to view lab/test results, encounter notes, upcoming appointments, etc.  Non-urgent messages can be sent to your provider as well.   To learn more about what you can do with MyChart, go to [email or web address].    Your next appointment:  6 months    Call in Oct to schedule Feb appointment    Provider:  Dr.Jordan

## 2023-07-05 NOTE — Progress Notes (Signed)
Cardiology Office Note   Date:  07/11/2023   ID:  Kyle Baldwin 1946-05-28, MRN [id number]  PCP:  Garlan Fillers, MD  Cardiologist:   Nitasha Jewel [place], MD   Chief Complaint  Patient presents with   Coronary Artery Disease      History of Present Illness: Kyle Baldwin is a 78 y.o. male who is seen for follow up post cardiac cath. He has a history of CAD s/p CABG in 2007 by Dr. Tyrone Sage. This included a sequential SVG to the PDA and PLOM and LIMA to the LAD.  He also has a history of HTN and Hyperlipidemia.   He was seen in the ED on 12//31/19 with an episode of SOB. States he awoke suddenly from sleep and couldn't breathe. Felt like his throat was closing up. Lasted about 5 minutes. No tongue swelling.  Symptoms resolved. CXR and Ecg were OK. Labs remarkable for creatinine 1.28.   He was diagnosed with COVID 19 in September 2021. Received monoclonal antibodies. Subsequently complained of palpitations. Event monitor placed. Event monitor showed rare PACs and PVCs. <1%. 4 brief runs of SVT longest 6 beats. No therapy recommended.   In 2023 he had chest pain.  Echo showed normal LV and valvular function. Subsequent cardiac cath showed patent grafts to the LAD and RCA. There was a significant stenosis in OM1. This was treated with balloon angioplasty. Unable to deliver a stent due to significant angulation and tortuosity of vessel. Was DC on ASA and Plavix for one month.  He was seen in early April 2024 with complaints of palpitations. He had an event monitor placed which showed frequent PVCs, occ PACs, runs of SVT and bradycardia. He was seen by Dr Ladona Ridgel in June who felt there were no good medical options for him and recommended conservative management.   He was admitted in May. He had a CT chest to follow up on mild aortic enlargement and was found to have acute PE. Unprovoked. Also had left LE DVT. He did have evidence of RV strain on CT and Echo. LV function was normal. Was  initially placed on heparin then transitioned to Eliquis.  On follow up today he feels very well. Denies any palpitations, chest pain or SOB. Still has swelling in both legs L>R. Per Dr Eloise Harman is on low dose Eliquis now. Is not exercising much but helps care for grandchildren.    Past Medical History:  Diagnosis Date   Bilateral carpal tunnel syndrome    Bladder cancer (HCC) 2005   bladder cancer in situ 2005   CAD (coronary artery disease)    Diverticulosis    Fatty liver    GERD (gastroesophageal reflux disease)    Hyperlipidemia    Hypertension    Kidney stones    Liver cyst right   hepatic lobe cyst   Myocardial infarction [facility]) 2007   heart bypass 2007   Vitamin B 12 deficiency     Past Surgical History:  Procedure Laterality Date   BLADDER SURGERY  2005   CORONARY STENT INTERVENTION N/A 08/12/2021   Procedure: CORONARY STENT INTERVENTION;  Surgeon: [place], Sinan Tuch M, MD;  Location: [facility] INVASIVE CV LAB;  Service: Cardiovascular;  Laterality: N/A;   heart bypass  2007   3 vessels   LEFT HEART CATH AND CORS/GRAFTS ANGIOGRAPHY N/A 08/12/2021   Procedure: LEFT HEART CATH AND CORS/GRAFTS ANGIOGRAPHY;  Surgeon: [place], Unknown Flannigan M, MD;  Location: [facility] INVASIVE CV LAB;  Service: Cardiovascular;  Laterality:  N/A;   LUMBAR LAMINECTOMY       Current Outpatient Medications  Medication Sig Dispense Refill   Ascorbic Acid (VITAMIN C) 1000 MG tablet Take 1,000 mg by mouth daily.     cyanocobalamin 1000 MCG tablet Take 1,000 mcg by mouth daily.      ELIQUIS 2.5 MG TABS tablet Take 2.5 mg by mouth 2 (two) times daily.     ipratropium (ATROVENT) 0.06 % nasal spray Place 1 spray into both nostrils as needed.     losartan (COZAAR) 100 MG tablet Take 100 mg by mouth daily.      nitroGLYCERIN (NITROSTAT) 0.4 MG SL tablet Place 1 tablet (0.4 mg total) under the tongue every 5 (five) minutes as needed for chest pain. 25 tablet 6   omeprazole (PRILOSEC OTC) 20 MG tablet Take 20 mg by mouth 2 (two)  times daily.     Polyethyl Glycol-Propyl Glycol (SYSTANE) 0.4-0.3 % SOLN Place 1 drop into both eyes daily as needed (Dry eye).     simvastatin (ZOCOR) 40 MG tablet TAKE 1 TABLET BY MOUTH EVERYDAY AT BEDTIME 90 tablet 2   sodium chloride (OCEAN) 0.65 % SOLN nasal spray Place 1 spray into both nostrils at bedtime.     Vitamin D, Ergocalciferol, (DRISDOL) 1.25 MG (50000 UNIT) CAPS capsule Take 50,000 Units by mouth once a week.     metoprolol tartrate (LOPRESSOR) 50 MG tablet Take 1 tablet (50 mg total) by mouth 2 (two) times daily. 180 tablet 3   No current facility-administered medications for this visit.    Allergies:   Codeine, Morphine and codeine, and Lisinopril    Social History:  The patient  reports that he quit smoking about 55 years ago. His smoking use included cigarettes. He has never used smokeless tobacco. He reports that he does not drink alcohol and does not use drugs.   Family History:  The patient's family history includes Colon cancer (age of onset: 57) in his maternal grandfather; Diabetes in his maternal grandmother; Heart disease in his maternal grandmother.    ROS:  Please see the history of present illness.   Otherwise, review of systems are positive for none.   All other systems are reviewed and negative.    PHYSICAL EXAM: VS:  BP 116/68   Pulse (!) 104   Ht 6' (1.829 m)   Wt 239 lb (108.4 kg)   SpO2 96%   BMI 32.41 kg/m  , BMI Body mass index is 32.41 kg/m. GENERAL:  Well appearing overweight WM in NAD. HEENT:  PERRL, EOMI, sclera are clear. Oropharynx is clear. NECK:  No jugular venous distention, carotid upstroke brisk and symmetric, no bruits, no thyromegaly or adenopathy LUNGS:  Clear to auscultation bilaterally CHEST:  Unremarkable HEART:  RRR,  PMI not displaced or sustained,S1 and S2 within normal limits, no S3, no S4: no clicks, no rubs, no murmurs ABD:  Soft, nontender. BS +, no masses or bruits. No hepatomegaly, no splenomegaly EXT:  2 + pulses  throughout, 1+ edema L>R, some chronic venous stasis. SKIN:  Warm and dry.  No rashes NEURO:  Alert and oriented x 3. Cranial nerves II through XII intact. PSYCH:  Cognitively intact    EKG:  EKG is  not ordered today.      Recent Labs: 09/30/2022: ALT 12; Magnesium 2.1 10/14/2022: B Natriuretic Peptide 142.2; BUN 21; Creatinine, Ser 1.26; Potassium 3.8; Sodium 140 10/16/2022: Hemoglobin 13.2; Platelets 158    Lipid Panel    Component Value  Date/Time   CHOL 120 09/30/2022 0820   TRIG 105 09/30/2022 0820   HDL 33 (L) 09/30/2022 0820   CHOLHDL 3.6 09/30/2022 0820   LDLCALC 67 09/30/2022 0820    Dated 03/29/23: cholesterol 133, triglycerides 132, HDL 36, LDL 73. A1c 5.6%.creatinine 1.44. otherwise CBC and CMET normal.   Wt Readings from Last 3 Encounters:  07/11/23 239 lb (108.4 kg)  01/12/23 239 lb 6.4 oz (108.6 kg)  11/15/22 231 lb 3.2 oz (104.9 kg)     Other studies Reviewed: Additional studies/ records that were reviewed today include: Myoview 06/23/15: Review of the above records demonstrates: Study Highlights   Nuclear stress EF: 51%. The left ventricular ejection fraction is mildly decreased (45-54%). There was no ST segment deviation noted during stress. No T wave inversion was noted during stress. Defect 1: There is a small defect of mild severity present in the basal inferior location. This is a low risk study.   Low risk stress nuclear study with a small fixed basal inferior fixed defect (small scar versus diaphragmatic attenuation artifact). No reversible ischemia is seen. Borderline left ventricular global systolic function   Event monitor. 03/19/20:Study Highlights  Normal sinus rhythm Rare PACs and PVCs Few brief runs of SVT less than 6 beats.   Echo 07/27/21: IMPRESSIONS     1. Left ventricular ejection fraction, by estimation, is 60 to 65%. The  left ventricle has normal function. The left ventricle has no regional  wall motion abnormalities. Left  ventricular diastolic parameters are  consistent with Grade I diastolic  dysfunction (impaired relaxation). The average left ventricular global  longitudinal strain is -24.8 %. The global longitudinal strain is normal.   2. Right ventricular systolic function was not well visualized. The right  ventricular size is normal. Tricuspid regurgitation signal is inadequate  for assessing PA pressure.   3. The mitral valve is grossly normal. No evidence of mitral valve  regurgitation.   4. The aortic valve is grossly normal. There is mild calcification of the  aortic valve. Aortic valve regurgitation is not visualized.   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   6. The inferior vena cava is normal in size with greater than 50%  respiratory variability, suggesting right atrial pressure of 3 mmHg.   Conclusion(s)/Recommendation(s): Otherwise normal echocardiogram, with  minor abnormalities described in the report. Recommend annual surveillance  for aortic root aneurysm.    Cardiac cath 08/12/21:  CORONARY STENT INTERVENTION  LEFT HEART CATH AND CORS/GRAFTS ANGIOGRAPHY   Conclusion      Dist RCA lesion is 100% stenosed.   Mid LAD lesion is 80% stenosed.   Mid LAD to Dist LAD lesion is 90% stenosed.   1st Diag lesion is 60% stenosed.   1st Mrg lesion is 90% stenosed.   Balloon angioplasty was performed using a BALLN SAPPHIRE 2.5X12.   Post intervention, there is a 25% residual stenosis.   SVG graft was visualized by angiography and is normal in caliber.   LIMA graft was visualized by angiography and is normal in caliber.   The graft exhibits no disease.   The graft exhibits no disease.   The left ventricular systolic function is normal.   LV end diastolic pressure is normal.   The left ventricular ejection fraction is 55-65% by visual estimate.   3 vessel obstructive CAD      - diffuse 90 % mid to distal LAD. 65% proximal first diagonal      - 90% first OM      -  100% distal RCA 2.    Patent LIMA to the LAD 3.   Patent SVG to PDA/PL 4.   Normal LV function 5.   Normal LVEDP 6.   Successful POBA of the first OM. Unable to deliver stent due to vessel tortuosity.     Plan: would continue DAPT with ASA and Plavix for one month. Monitor clinically. If patient has restenosis of the OM would need to consider more aggressive approach from femoral artery. Would start with a 4 cm guide and consider heavy duty wire. Theron Arista support offered little assistance.   Diagnostic Dominance: Right Intervention    Event monitor 09/26/22: Study Highlights      Normal sinus rhythm   Frequent PACs,PAC couplets and short runs of PAT. longest 10 beats. Slowest HR of 33 is due to NSR wiht blocked PACs in bigeminal pattern   Rare PVCs   No prolonged pauses   No Afib     Patch Wear Time:  13 days and 0 hours (2024-04-08T19:49:46-0400 to 2024-04-21T20:09:03-399)  Echo 10/14/22: IMPRESSIONS     1. Left ventricular ejection fraction, by estimation, is 60 to 65%. The  left ventricle has normal function. The left ventricle has no regional  wall motion abnormalities. Left ventricular diastolic parameters are  consistent with Grade I diastolic  dysfunction (impaired relaxation).   2. The RV is not well visualized but in limited images it does appear  dilated and at least mildly hypokinetic indicative of RV strain. Right  ventricular systolic function is mildly reduced. The right ventricular  size is moderately enlarged. Tricuspid  regurgitation signal is inadequate for assessing PA pressure.   3. Right atrial size was mildly dilated.   4. The mitral valve is normal in structure. No evidence of mitral valve  regurgitation. No evidence of mitral stenosis.   5. The aortic valve is tricuspid. There is mild calcification of the  aortic valve. Aortic valve regurgitation is not visualized. Aortic valve  sclerosis/calcification is present, without any evidence of aortic  stenosis. Aortic valve  area, by VTI measures   3.13 cm. Aortic valve mean gradient measures 4.0 mmHg. Aortic valve Vmax  measures 1.36 m/s.   ASSESSMENT AND PLAN:  1.  CAD s/p CABG 2007. Myoview Jan 2017 was low risk. In 2023 LV function normal  on Echo. Cardiac cath March 2023 showed patent LIMA to the LAD and SVG to the RCA. Significant stenosis in OM1 treated with POBA. Unable to deliver stent due to vessel angulation/tortuosity. Has stable  angina class 1. Continue metoprolol. Not on ASA since on anticoagulation  2. HTN controlled. Continue metoprolol and losartan  3. Hyperlipidemia.  on Zocor.  Last LDL 73. Ideally would like to see LDL < 55. He is due for follow up labs with Dr Eloise Harman. If not at goal consider switching to more potent statin or adding Zetia or Repatha  4. Palpitations/tachycardia.   Symptoms clinically improved. This may have been related more to his PE.   5. No thoracic aneurysm seen on CT. Previously noted mild enlargement on Echo. No further follow up needed.   6. Acute PE/left LE DVT May 2024. Now on Eliquis. Unprovoked. Will repeat lower extremity venous doppler. He still has leg swelling L>R. If he still has evidence of thrombus I would favor full strength Eliquis.   Disposition: follow up in 6 months.  Signed, Ford Peddie [place], MD  07/11/2023 9:39 AM    [facility] [address] [place], [place], [address] Phone  [phone], Fax [phone]

## 2023-07-06 ENCOUNTER — Other Ambulatory Visit: Payer: Self-pay | Admitting: Cardiology

## 2023-07-11 ENCOUNTER — Ambulatory Visit: Payer: Medicare Other | Attending: Cardiology | Admitting: Cardiology

## 2023-07-11 ENCOUNTER — Encounter: Payer: Self-pay | Admitting: Cardiology

## 2023-07-11 VITALS — BP 116/68 | HR 84 | Ht 72.0 in | Wt 239.0 lb

## 2023-07-11 DIAGNOSIS — I2510 Atherosclerotic heart disease of native coronary artery without angina pectoris: Secondary | ICD-10-CM | POA: Diagnosis not present

## 2023-07-11 DIAGNOSIS — I471 Supraventricular tachycardia, unspecified: Secondary | ICD-10-CM

## 2023-07-11 DIAGNOSIS — I82512 Chronic embolism and thrombosis of left femoral vein: Secondary | ICD-10-CM | POA: Diagnosis not present

## 2023-07-11 DIAGNOSIS — E7800 Pure hypercholesterolemia, unspecified: Secondary | ICD-10-CM

## 2023-07-11 DIAGNOSIS — I2699 Other pulmonary embolism without acute cor pulmonale: Secondary | ICD-10-CM | POA: Diagnosis not present

## 2023-07-11 NOTE — Patient Instructions (Signed)
Medication Instructions:  Continue same medications *If you need a refill on your cardiac medications before your next appointment, please call your pharmacy*   Lab Work: None ordered   Testing/Procedures: Venous Doppler left lower leg    first available   Follow-Up: At [facility], you and your health needs are our priority.  As part of our continuing mission to provide you with exceptional heart care, we have created designated Provider Care Teams.  These Care Teams include your primary Cardiologist (physician) and Advanced Practice Providers (APPs -  Physician Assistants and Nurse Practitioners) who all work together to provide you with the care you need, when you need it.  We recommend signing up for the patient portal called "MyChart".  Sign up information is provided on this After Visit Summary.  MyChart is used to connect with patients for Virtual Visits (Telemedicine).  Patients are able to view lab/test results, encounter notes, upcoming appointments, etc.  Non-urgent messages can be sent to your provider as well.   To learn more about what you can do with MyChart, go to [email or web address].    Your next appointment:  1 year   Call in Oct to schedule Feb appointment   ( New Office )    Provider:  Dr.Jordan

## 2023-07-20 ENCOUNTER — Ambulatory Visit (HOSPITAL_COMMUNITY)
Admission: RE | Admit: 2023-07-20 | Discharge: 2023-07-20 | Disposition: A | Discharge status: Home / Self Care | Payer: Medicare Other | Source: Ambulatory Visit | Attending: Cardiology | Admitting: Cardiology

## 2023-07-20 DIAGNOSIS — I82512 Chronic embolism and thrombosis of left femoral vein: Secondary | ICD-10-CM | POA: Diagnosis present

## 2023-07-20 DIAGNOSIS — I2699 Other pulmonary embolism without acute cor pulmonale: Secondary | ICD-10-CM | POA: Insufficient documentation

## 2023-07-20 DIAGNOSIS — E7800 Pure hypercholesterolemia, unspecified: Secondary | ICD-10-CM | POA: Insufficient documentation

## 2023-07-20 DIAGNOSIS — I2510 Atherosclerotic heart disease of native coronary artery without angina pectoris: Secondary | ICD-10-CM | POA: Diagnosis present

## 2023-07-20 DIAGNOSIS — I471 Supraventricular tachycardia, unspecified: Secondary | ICD-10-CM | POA: Insufficient documentation

## 2023-07-21 ENCOUNTER — Telehealth: Payer: Self-pay

## 2023-07-21 ENCOUNTER — Other Ambulatory Visit: Payer: Self-pay

## 2023-07-21 MED ORDER — APIXABAN 5 MG PO TABS: 5.0000 mg | ORAL_TABLET | Freq: Two times a day (BID) | ORAL | 3 refills | Status: AC

## 2023-07-21 NOTE — Telephone Encounter (Signed)
 Spoke to patient lower ext venous doppler results given.He will increase Eliquis to 5 mg twice a day.He wanted to ask Dr.Jordan since he continues to have dvt in left lower leg does he need a chest ct to check on blood clot in lung.Stated he is not having any symptoms,no sob.I will send message to Dr.Jordan for advice.

## 2023-07-21 NOTE — Telephone Encounter (Signed)
 Spoke to patient Dr.Jordan's advice given.Advised to take Eliquis 5 mg twice a day.

## 2024-04-01 ENCOUNTER — Other Ambulatory Visit: Payer: Self-pay | Admitting: Cardiology

## 2024-04-03 MED ORDER — SIMVASTATIN 40 MG PO TABS: 40.0000 mg | ORAL_TABLET | Freq: Every day | ORAL | 0 refills | Status: AC

## 2024-05-20 ENCOUNTER — Other Ambulatory Visit (HOSPITAL_COMMUNITY): Payer: Self-pay | Admitting: Internal Medicine

## 2024-05-20 ENCOUNTER — Ambulatory Visit (HOSPITAL_COMMUNITY)
Admission: RE | Admit: 2024-05-20 | Discharge: 2024-05-20 | Disposition: A | Discharge status: Home / Self Care | Source: Ambulatory Visit | Attending: Surgery | Admitting: Surgery

## 2024-05-20 DIAGNOSIS — I739 Peripheral vascular disease, unspecified: Secondary | ICD-10-CM | POA: Diagnosis not present

## 2024-05-20 LAB — VAS US ABI WITH/WO TBI
Left ABI: 1.29
Right ABI: 1.27

## 2024-07-04 ENCOUNTER — Other Ambulatory Visit: Payer: Self-pay | Admitting: Cardiology

## 2024-07-12 ENCOUNTER — Ambulatory Visit: Admitting: Cardiology
